# Patient Record
Sex: Female | Born: 2000 | Race: Black or African American | Hispanic: No | Marital: Single | Smoking: Never smoker
Health system: Southern US, Community
[De-identification: ages and names within clinical notes are randomized; demographics above are authoritative.]

## PROBLEM LIST (undated history)

## (undated) DIAGNOSIS — G43909 Migraine, unspecified, not intractable, without status migrainosus: Secondary | ICD-10-CM

## (undated) DIAGNOSIS — H4720 Unspecified optic atrophy: Secondary | ICD-10-CM

## (undated) DIAGNOSIS — R569 Unspecified convulsions: Secondary | ICD-10-CM

## (undated) DIAGNOSIS — K219 Gastro-esophageal reflux disease without esophagitis: Secondary | ICD-10-CM

## (undated) DIAGNOSIS — H547 Unspecified visual loss: Secondary | ICD-10-CM

## (undated) DIAGNOSIS — K5900 Constipation, unspecified: Secondary | ICD-10-CM

## (undated) DIAGNOSIS — G919 Hydrocephalus, unspecified: Secondary | ICD-10-CM

## (undated) DIAGNOSIS — B977 Papillomavirus as the cause of diseases classified elsewhere: Secondary | ICD-10-CM

## (undated) DIAGNOSIS — H4010X0 Unspecified open-angle glaucoma, stage unspecified: Secondary | ICD-10-CM

## (undated) DIAGNOSIS — I10 Essential (primary) hypertension: Secondary | ICD-10-CM

## (undated) DIAGNOSIS — G4731 Primary central sleep apnea: Secondary | ICD-10-CM

## (undated) HISTORY — DX: Migraine, unspecified, not intractable, without status migrainosus: G43.909

## (undated) HISTORY — DX: Unspecified optic atrophy: H47.20

## (undated) HISTORY — DX: Unspecified open-angle glaucoma, stage unspecified: H40.10X0

## (undated) HISTORY — DX: Gastro-esophageal reflux disease without esophagitis: K21.9

## (undated) HISTORY — DX: Constipation, unspecified: K59.00

## (undated) HISTORY — PX: BRAIN SURGERY: SHX531

## (undated) HISTORY — DX: Unspecified convulsions: R56.9

## (undated) HISTORY — DX: Primary central sleep apnea: G47.31

## (undated) HISTORY — PX: VENTRICULOPERITONEAL SHUNT: SHX204

## (undated) HISTORY — DX: Papillomavirus as the cause of diseases classified elsewhere: B97.7

---
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
## 2018-01-07 DIAGNOSIS — M25572 Pain in left ankle and joints of left foot: Secondary | ICD-10-CM | POA: Diagnosis not present

## 2018-01-07 DIAGNOSIS — M6281 Muscle weakness (generalized): Secondary | ICD-10-CM | POA: Diagnosis not present

## 2018-01-07 DIAGNOSIS — M25672 Stiffness of left ankle, not elsewhere classified: Secondary | ICD-10-CM | POA: Diagnosis not present

## 2018-01-07 DIAGNOSIS — R262 Difficulty in walking, not elsewhere classified: Secondary | ICD-10-CM | POA: Diagnosis not present

## 2018-01-09 DIAGNOSIS — M6281 Muscle weakness (generalized): Secondary | ICD-10-CM | POA: Diagnosis not present

## 2018-01-09 DIAGNOSIS — R262 Difficulty in walking, not elsewhere classified: Secondary | ICD-10-CM | POA: Diagnosis not present

## 2018-01-09 DIAGNOSIS — M25572 Pain in left ankle and joints of left foot: Secondary | ICD-10-CM | POA: Diagnosis not present

## 2018-01-09 DIAGNOSIS — M25672 Stiffness of left ankle, not elsewhere classified: Secondary | ICD-10-CM | POA: Diagnosis not present

## 2018-01-11 ENCOUNTER — Ambulatory Visit: Payer: 59 | Admitting: Gastroenterology

## 2018-01-16 DIAGNOSIS — M6281 Muscle weakness (generalized): Secondary | ICD-10-CM | POA: Diagnosis not present

## 2018-01-16 DIAGNOSIS — R262 Difficulty in walking, not elsewhere classified: Secondary | ICD-10-CM | POA: Diagnosis not present

## 2018-01-16 DIAGNOSIS — M25572 Pain in left ankle and joints of left foot: Secondary | ICD-10-CM | POA: Diagnosis not present

## 2018-01-16 DIAGNOSIS — M25672 Stiffness of left ankle, not elsewhere classified: Secondary | ICD-10-CM | POA: Diagnosis not present

## 2018-01-23 DIAGNOSIS — M6281 Muscle weakness (generalized): Secondary | ICD-10-CM | POA: Diagnosis not present

## 2018-01-23 DIAGNOSIS — R262 Difficulty in walking, not elsewhere classified: Secondary | ICD-10-CM | POA: Diagnosis not present

## 2018-01-23 DIAGNOSIS — M25672 Stiffness of left ankle, not elsewhere classified: Secondary | ICD-10-CM | POA: Diagnosis not present

## 2018-01-23 DIAGNOSIS — M25572 Pain in left ankle and joints of left foot: Secondary | ICD-10-CM | POA: Diagnosis not present

## 2018-01-25 DIAGNOSIS — M25572 Pain in left ankle and joints of left foot: Secondary | ICD-10-CM | POA: Diagnosis not present

## 2018-01-25 DIAGNOSIS — M25672 Stiffness of left ankle, not elsewhere classified: Secondary | ICD-10-CM | POA: Diagnosis not present

## 2018-01-25 DIAGNOSIS — R262 Difficulty in walking, not elsewhere classified: Secondary | ICD-10-CM | POA: Diagnosis not present

## 2018-01-25 DIAGNOSIS — M6281 Muscle weakness (generalized): Secondary | ICD-10-CM | POA: Diagnosis not present

## 2018-02-25 DIAGNOSIS — Z0279 Encounter for issue of other medical certificate: Secondary | ICD-10-CM

## 2018-03-01 DIAGNOSIS — M25572 Pain in left ankle and joints of left foot: Secondary | ICD-10-CM | POA: Diagnosis not present

## 2018-03-01 DIAGNOSIS — R262 Difficulty in walking, not elsewhere classified: Secondary | ICD-10-CM | POA: Diagnosis not present

## 2018-03-01 DIAGNOSIS — M6281 Muscle weakness (generalized): Secondary | ICD-10-CM | POA: Diagnosis not present

## 2018-03-01 DIAGNOSIS — M25672 Stiffness of left ankle, not elsewhere classified: Secondary | ICD-10-CM | POA: Diagnosis not present

## 2018-03-04 DIAGNOSIS — M25572 Pain in left ankle and joints of left foot: Secondary | ICD-10-CM | POA: Diagnosis not present

## 2018-03-04 DIAGNOSIS — M6281 Muscle weakness (generalized): Secondary | ICD-10-CM | POA: Diagnosis not present

## 2018-03-04 DIAGNOSIS — R262 Difficulty in walking, not elsewhere classified: Secondary | ICD-10-CM | POA: Diagnosis not present

## 2018-03-04 DIAGNOSIS — M25672 Stiffness of left ankle, not elsewhere classified: Secondary | ICD-10-CM | POA: Diagnosis not present

## 2018-03-05 ENCOUNTER — Other Ambulatory Visit
Admission: RE | Admit: 2018-03-05 | Discharge: 2018-03-05 | Disposition: A | Discharge status: Home / Self Care | Payer: 59 | Source: Ambulatory Visit | Attending: Internal Medicine | Admitting: Internal Medicine

## 2018-03-05 ENCOUNTER — Telehealth: Payer: Self-pay

## 2018-03-05 DIAGNOSIS — Z111 Encounter for screening for respiratory tuberculosis: Secondary | ICD-10-CM

## 2018-03-05 NOTE — Telephone Encounter (Signed)
Ordered Quantiferin gold blood test per paperwork and per Dr French Anaracy.  Patient will have blood test at hospital.  Paperwork that needs to be filled out is on my desk.

## 2018-03-06 DIAGNOSIS — M25572 Pain in left ankle and joints of left foot: Secondary | ICD-10-CM | POA: Diagnosis not present

## 2018-03-06 DIAGNOSIS — M25672 Stiffness of left ankle, not elsewhere classified: Secondary | ICD-10-CM | POA: Diagnosis not present

## 2018-03-06 DIAGNOSIS — R262 Difficulty in walking, not elsewhere classified: Secondary | ICD-10-CM | POA: Diagnosis not present

## 2018-03-06 DIAGNOSIS — M6281 Muscle weakness (generalized): Secondary | ICD-10-CM | POA: Diagnosis not present

## 2018-03-07 ENCOUNTER — Ambulatory Visit (INDEPENDENT_AMBULATORY_CARE_PROVIDER_SITE_OTHER): Payer: 59 | Admitting: Cardiovascular Disease

## 2018-03-07 ENCOUNTER — Encounter: Payer: Self-pay | Admitting: Cardiovascular Disease

## 2018-03-07 VITALS — BP 122/86 | HR 93 | Ht <= 58 in | Wt 128.0 lb

## 2018-03-07 DIAGNOSIS — R55 Syncope and collapse: Secondary | ICD-10-CM | POA: Diagnosis not present

## 2018-03-07 DIAGNOSIS — H547 Unspecified visual loss: Secondary | ICD-10-CM

## 2018-03-07 DIAGNOSIS — R000 Tachycardia, unspecified: Secondary | ICD-10-CM

## 2018-03-07 DIAGNOSIS — I10 Essential (primary) hypertension: Secondary | ICD-10-CM | POA: Diagnosis not present

## 2018-03-07 DIAGNOSIS — H4720 Unspecified optic atrophy: Secondary | ICD-10-CM | POA: Diagnosis not present

## 2018-03-07 MED ORDER — PROPRANOLOL HCL 10 MG PO TABS: 10.0000 mg | ORAL_TABLET | Freq: Three times a day (TID) | ORAL | 3 refills | Status: DC | PRN

## 2018-03-07 NOTE — Progress Notes (Signed)
Cardiology Office Note  Date:  03/07/2018   ID:  Dawn Stark, DOB 2001/06/30, MRN [id number]  PCP:  Stark-Scocuzza, Dawn Spillersracy N, MD   Chief Complaint  Patient presents with  . New Patient (Initial Visit)    Referred by PCP for Tachycardia and Syncope in Feb. Meds reviewed verbally with patient.     HPI:  [name] is a 17 year old woman with past medical history of Syncope blind Chronic constipation Headaches Referred by Dr. Sheralyn Boatmanracey Stark for consultation of her sinus tachycardia and syncope  Chronic constipation since age 89 History of hydrocephalus status post shunt Legally blind Previously tried [place] and [place]ka Lasix Lots of straining, blood when wiping Recently started on linzess  Notes indicating episode of syncope while at school Legs got weak had loss of consciousness with fall Had not skipped any meals Denied any incontinence Fractured her left foot during the fall  Previous notes indicating blood pressure 112/80 on atenolol in November 2018 Rate at that time low 100  Seen by neurology November 2018 heart rate 95 blood pressure 114/82  Seen by GI November 2018 blood pressure 104/72 pulse 101  Weight appears to be trending up over the past year Up over 25 pounds  Chronically elevated heart rate low 100s  Previously on atenolol, now taking metoprolol succinate 50 daily with no symptoms  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 She had abdominal cramping, urgency Felt lightheaded and went down Friend tried to get her up but she was still lightheaded Symptoms eventually resolved without intervention She fractured her ankle falling down No further episodes since that time  Does not drink much fluids Prefers to drink [employer]ountain Dew  Lab work reviewedFebruary 2019 TSH normal, BMP and CBC normal  EKG personally reviewed by myself on todays  visit Shows sinus tachycardia rate 96 bpm no significant ST or T-wave changes   PMH:   has a past medical history of Blind, Central sleep apnea, Constipation, GERD (gastroesophageal reflux disease), Hydrocephalus, Hypertension, Migraine, Open-angle glaucoma, Optic atrophy, both eyes, Preterm delivery, and Seizures (HCC).  PSH:    Past Surgical History:  Procedure Laterality Date  . BRAIN SURGERY     And shunt placement  . CRAINOTOMY Left 01/2012   for dural biopsy  . EXCISION OF SUBGLOTIC CYST  01/1999  . FRONTAL BOLT PLACEMENT Left 02/2010  . 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 VENTRICULOPERITONEAL SHUNT Right 10/1998  . VENTRICULOPERITONEAL SHUNT  12/2005, 03/2006, 09/2009   REVISIONS    Current Outpatient Medications  Medication Sig Dispense Refill  . acetaminophen (TYLENOL) 325 MG tablet Take by mouth.    . butalbital-acetaminophen-caffeine (FIORICET, ESGIC) 50-325-40 MG tablet butalbital-acetaminophen-caffeine 50 mg-325 mg-40 mg tablet    . etonogestrel (NEXPLANON) 68 MG IMPL implant 68 mg.    . famotidine (PEPCID) 20 MG tablet Take 20 mg by mouth 2 (two) times daily.    Marland Kitchen. FIBER SELECT GUMMIES PO Take 1 tablet daily by mouth.    Marland Kitchen. ibuprofen (ADVIL,MOTRIN) 800 MG tablet Take by mouth.    . linaclotide (LINZESS) 290 MCG CAPS capsule Take 1 capsule (290 mcg total) by mouth every other day for 90 doses. Do not crush/chew 90 capsule 0  . LINZESS 145 MCG CAPS capsule TAKE 1 CAPSULE BY MOUTH ONCE DAILY BEFORE BREAKFAST (DO  NOT  CRUSH  OR  CHEW)  1  . Melatonin 3  MG TABS Take 6 mg by mouth.     . metoprolol succinate (TOPROL-XL) 50 MG 24 hr tablet Take 1 tablet (50 mg total) by mouth at bedtime. 90 tablet 1  . nortriptyline (PAMELOR) 25 MG capsule Take 1 capsule (25 mg total) by mouth at bedtime. 90 capsule 1  . ondansetron (ZOFRAN-ODT) 4 MG disintegrating tablet Take by mouth.    . promethazine (PHENERGAN) 25 MG tablet Take by mouth.    . venlafaxine (EFFEXOR) 37.5 MG tablet Take  1 tab once a day for a week then increase to 1 tab twice a day    . zonisamide (ZONEGRAN) 100 MG capsule Take by mouth.     No current facility-administered medications for this visit.     Allergies:   Ativan [lorazepam] and Latex   Social History:  The patient  reports that she has never smoked. She has never used smokeless tobacco. She reports that she does not drink alcohol or use drugs.   Family History:   family history includes Heart attack in her father; Heart disease in her father.    Review of Systems: Review of Systems  Constitutional: Negative.   Respiratory: Negative.   Cardiovascular: Negative.   Gastrointestinal: Negative.   Musculoskeletal: Negative.   Neurological: Negative.        Remote syncope  Psychiatric/Behavioral: Negative.   All other systems reviewed and are negative.    PHYSICAL EXAM: VS:  BP 122/86 (BP Location: Left Arm, Patient Position: Sitting, Cuff Size: Normal)   Pulse 93   Ht 4\' 9"  (1.448 m)   Wt 128 lb (58.1 kg)   BMI 27.70 kg/m  , BMI Body mass index is 27.7 kg/m. GEN: Well nourished, well developed, in no acute distress  HEENT: normal  Neck: no JVD, carotid bruits, or masses Cardiac: RRR; no murmurs, rubs, or gallops,no edema  Respiratory:  clear to auscultation bilaterally, normal work of breathing GI: soft, nontender, nondistended, + BS MS: no deformity or atrophy  Skin: warm and dry, no rash Neuro:  Strength and sensation are intact Psych: euthymic mood, full affect  Recent Labs: 09/14/2017: ALT 10; BUN 16; Creat 0.76; Hemoglobin 12.7; Platelets 384; Potassium 4.2; Sodium 138; TSH 2.15    Lipid Panel No results found for: CHOL, HDL, LDLCALC, TRIG    Wt Readings from Last 3 Encounters:  03/07/18 128 lb (58.1 kg) (51 %, Z= 0.02)*  12/18/17 119 lb 6.4 oz (54.2 kg) (35 %, Z= -0.40)*  09/14/17 111 lb 3.2 oz (50.4 kg) (19 %, Z= -0.88)*   * Growth percentiles are based on CDC (Girls, 2-20 Years) data.     ASSESSMENT AND  PLAN:  Syncope, unspecified syncope type Symptoms suggestive of vasovagal etiology in the setting of having abdominal cramping prior to bowel movement.  No further episodes since that time Recommended she stay hydrated. Family reports that she does not drink much fluids Continue to work with GI concerning her constipation issues  Sinus tachycardia chronic issue,for the most part is asymptomatic Event monitor has been ordered at her request recommended aggressive hydration For high level of exercise she may warrant additional beta blocker such as propranolol or extra half dose of metoprolol. Prescription provided for propranolol In general does not do regular competitive athletics and is nonsymptomatic Previously did cheerleading and was relatively asymptomatic.   Essential hypertension Blood pressure is well controlled on today's visit. No changes made to the medications.  Blind Previously attended [employer] [facility] [employer]  for college  Disposition:   F/U  As needed   Total encounter time more than 60 minutes  Greater than 50% was spent in counseling and coordination of care with the patient  Patient was seen in consultation for Dr. Desma Maxim  LV referred back to her office for ongoing care of the issues detailed above   No orders of the defined types were placed in this encounter.    Signed, Dossie Arbour, M.D., Ph.D. 03/07/2018  [facility] [place], [place] [phone]

## 2018-03-07 NOTE — Patient Instructions (Addendum)
Medication Instructions:   Please take propranolol as needed for shortness of breath, chest tightness, tachycardia   Labwork:  No new labs needed  Testing/Procedures:  We will order a 2 week monitor to evaluate heart rhythm/heart rate Sinus tachycardia   Follow-Up: It was a pleasure seeing you in the office today. Please call us if you have new issues that need to be addressed before your next appt.  [phone]  Your physician wants you to follow-up in:  As needed  If you need a refill on your cardiac medications before your next appointment, please call your pharmacy.  For educational health videos Log in to : [email or web address] Or : [email or web address], password : [email or web address]

## 2018-03-08 DIAGNOSIS — M6281 Muscle weakness (generalized): Secondary | ICD-10-CM | POA: Diagnosis not present

## 2018-03-08 DIAGNOSIS — R262 Difficulty in walking, not elsewhere classified: Secondary | ICD-10-CM | POA: Diagnosis not present

## 2018-03-08 DIAGNOSIS — M25672 Stiffness of left ankle, not elsewhere classified: Secondary | ICD-10-CM | POA: Diagnosis not present

## 2018-03-08 DIAGNOSIS — M25572 Pain in left ankle and joints of left foot: Secondary | ICD-10-CM | POA: Diagnosis not present

## 2018-03-12 ENCOUNTER — Ambulatory Visit: Payer: Self-pay

## 2018-03-12 LAB — QUANTIFERON-TB GOLD PLUS: QUANTIFERON-TB GOLD PLUS: NEGATIVE

## 2018-03-12 LAB — QUANTIFERON-TB GOLD PLUS (RQFGPL)
QUANTIFERON TB1 AG VALUE: 0.02 [IU]/mL
QuantiFERON Mitogen Value: >10 IU/mL
QuantiFERON Nil Value: 0.02 IU/mL
QuantiFERON TB2 Ag Value: 0.02 IU/mL

## 2018-03-12 NOTE — Telephone Encounter (Signed)
fyi

## 2018-03-12 NOTE — Telephone Encounter (Signed)
Mom called to report pt. Has a painful knot behind her right ear at her shunt site. No other symptoms - no fever. Mom has been applying ice packs. Reports she called Neurology (Dr. Malvin JohnsPotter) and was instructed to call her PCP. Practice [facility]) instructing pt. To go to ED or UC. Mom notified and verbalizes understanding.  Reason for Disposition . Earache  (Exceptions: brief ear pain of < 60 minutes duration, earache occurring during air travel  Answer Assessment - Initial Assessment Questions 1. LOCATION: "Which ear is involved?"     Right ear 2. ONSET: "When did the ear start hurting"      3 days 3. SEVERITY: "How bad is the pain?"  (Scale 1-10; mild, moderate or severe)   - MILD (1-3): doesn't interfere with normal activities    - MODERATE (4-7): interferes with normal activities or awakens from sleep    - SEVERE (8-10): excruciating pain, unable to do any normal activities       8-9 4. URI SYMPTOMS: " Do you have a runny nose or cough?"     No 5. FEVER: "Do you have a fever?" If so, ask: "What is your temperature, how was it measured, and when did it start?"     No 6. CAUSE: "Have you been swimming recently?", "How often do you use Q-TIPS?", "Have you had any recent air travel or scuba diving?"     No 7. OTHER SYMPTOMS: "Do you have any other symptoms?" (e.g., headache, stiff neck, dizziness, vomiting, runny nose, decreased hearing)     No 8. PREGNANCY: "Is there any chance you are pregnant?" "When was your last menstrual period?"     No  Protocols used: EARACHE-A-AH

## 2018-03-12 NOTE — Telephone Encounter (Signed)
Patient should have appt to evaluate ear/shunt site  Thanks, LG

## 2018-03-13 ENCOUNTER — Encounter: Payer: Self-pay | Admitting: *Deleted

## 2018-03-13 ENCOUNTER — Telehealth: Payer: Self-pay | Admitting: Cardiovascular Disease

## 2018-03-13 DIAGNOSIS — G911 Obstructive hydrocephalus: Secondary | ICD-10-CM | POA: Diagnosis not present

## 2018-03-13 DIAGNOSIS — Z982 Presence of cerebrospinal fluid drainage device: Secondary | ICD-10-CM | POA: Diagnosis not present

## 2018-03-13 DIAGNOSIS — G44221 Chronic tension-type headache, intractable: Secondary | ICD-10-CM | POA: Diagnosis not present

## 2018-03-13 DIAGNOSIS — R51 Headache: Secondary | ICD-10-CM | POA: Diagnosis not present

## 2018-03-13 NOTE — Telephone Encounter (Signed)
Pt mother is calling states pt HR is 138. Denies any other symptoms. States pt ZIO monitor has came off and is unable to put it back on.

## 2018-03-13 NOTE — Telephone Encounter (Signed)
No answer/No voicemail available  

## 2018-03-14 NOTE — Telephone Encounter (Signed)
Patient's mother came by  Stated the best number to get in contact with her is [phone], she will be there until 3p

## 2018-03-14 NOTE — Telephone Encounter (Signed)
Tried the number listed [phone]), it rang several times and no answer/voicemail.  Attempted home number and no answer and no voicemail.

## 2018-03-14 NOTE — Telephone Encounter (Signed)
No answer.  No voicemail setup

## 2018-03-15 ENCOUNTER — Other Ambulatory Visit: Payer: Self-pay | Admitting: Gastroenterology

## 2018-03-15 DIAGNOSIS — K5909 Other constipation: Secondary | ICD-10-CM

## 2018-03-15 DIAGNOSIS — K581 Irritable bowel syndrome with constipation: Secondary | ICD-10-CM

## 2018-03-15 NOTE — Telephone Encounter (Signed)
Spoke with patients mother per release form and she reports that her daughters heart rate was 138 during recent appointment with neurology. She also states that monitor fell off yesterday as well. Reviewed that since monitor was on for one week we could wait and see if there was enough data and if needed we could also place another. Instructed her to send monitor back and we can see what that shows. Instructed her to also have patient take the propranolol for fast heart rates and to monitor blood pressures as well. She verbalized understanding of our conversation, agreement with plan and had no further questions at this time.

## 2018-03-18 ENCOUNTER — Telehealth: Payer: Self-pay

## 2018-03-18 NOTE — Telephone Encounter (Signed)
Patients mother contacted office.  Her daughter is no longer having regular bowel movements despite  taking 290 linzess.  She states that you mentioned she may need a procedure Colon or EGD.  [facility].  Thanks [employer]le

## 2018-03-19 ENCOUNTER — Other Ambulatory Visit: Payer: Self-pay

## 2018-03-19 DIAGNOSIS — K5909 Other constipation: Secondary | ICD-10-CM

## 2018-03-19 DIAGNOSIS — K581 Irritable bowel syndrome with constipation: Secondary | ICD-10-CM

## 2018-03-19 MED ORDER — LINACLOTIDE 290 MCG PO CAPS: 290.0000 ug | ORAL_CAPSULE | Freq: Every day | ORAL | 0 refills | Status: DC

## 2018-03-19 NOTE — Telephone Encounter (Signed)
Spoke with pt's mom and she stated that patient takes Miralax daily along with the linzess but still has no relief, but pt is currently out of linzess and mom is requesting a refill she continued taking Miralax but is not helping much. Refill for linzess 290 has been sent to pharmacy. Would you like for patient to schedule appt? Please advise    Thanks TG

## 2018-03-22 ENCOUNTER — Ambulatory Visit: Payer: 59 | Admitting: Gastroenterology

## 2018-03-28 DIAGNOSIS — M25672 Stiffness of left ankle, not elsewhere classified: Secondary | ICD-10-CM | POA: Diagnosis not present

## 2018-03-28 DIAGNOSIS — M25572 Pain in left ankle and joints of left foot: Secondary | ICD-10-CM | POA: Diagnosis not present

## 2018-03-28 DIAGNOSIS — M6281 Muscle weakness (generalized): Secondary | ICD-10-CM | POA: Diagnosis not present

## 2018-03-28 DIAGNOSIS — R262 Difficulty in walking, not elsewhere classified: Secondary | ICD-10-CM | POA: Diagnosis not present

## 2018-04-11 NOTE — Telephone Encounter (Signed)
Spoke with patients mother regarding heart monitor that has not been received. She reports that patients father was supposed to mail it back but has been in the hospital. She will check on this monitor and make sure that it gets sent out. Let her know that once we get report we will call with results. She verbalized understanding with no further questions at this time.

## 2018-04-18 DIAGNOSIS — M25672 Stiffness of left ankle, not elsewhere classified: Secondary | ICD-10-CM | POA: Diagnosis not present

## 2018-04-18 DIAGNOSIS — M25572 Pain in left ankle and joints of left foot: Secondary | ICD-10-CM | POA: Diagnosis not present

## 2018-04-18 DIAGNOSIS — R262 Difficulty in walking, not elsewhere classified: Secondary | ICD-10-CM | POA: Diagnosis not present

## 2018-04-18 DIAGNOSIS — M6281 Muscle weakness (generalized): Secondary | ICD-10-CM | POA: Diagnosis not present

## 2018-04-25 DIAGNOSIS — M6281 Muscle weakness (generalized): Secondary | ICD-10-CM | POA: Diagnosis not present

## 2018-04-25 DIAGNOSIS — R262 Difficulty in walking, not elsewhere classified: Secondary | ICD-10-CM | POA: Diagnosis not present

## 2018-04-25 DIAGNOSIS — M25572 Pain in left ankle and joints of left foot: Secondary | ICD-10-CM | POA: Diagnosis not present

## 2018-04-25 DIAGNOSIS — M25672 Stiffness of left ankle, not elsewhere classified: Secondary | ICD-10-CM | POA: Diagnosis not present

## 2018-04-29 ENCOUNTER — Telehealth: Payer: Self-pay

## 2018-04-29 DIAGNOSIS — M25572 Pain in left ankle and joints of left foot: Secondary | ICD-10-CM | POA: Diagnosis not present

## 2018-04-29 DIAGNOSIS — M25672 Stiffness of left ankle, not elsewhere classified: Secondary | ICD-10-CM | POA: Diagnosis not present

## 2018-04-29 DIAGNOSIS — R262 Difficulty in walking, not elsewhere classified: Secondary | ICD-10-CM | POA: Diagnosis not present

## 2018-04-29 DIAGNOSIS — M6281 Muscle weakness (generalized): Secondary | ICD-10-CM | POA: Diagnosis not present

## 2018-04-29 NOTE — Telephone Encounter (Signed)
Tried calling no answer , unable to leave message

## 2018-04-29 NOTE — Telephone Encounter (Signed)
Copied from CRM [id number]. Topic: Inquiry >> Apr 29, 2018  9:54 AM Alexander Bergeron B wrote: Reason for CRM: pt's mother called and states she has a shot on the right side of her neck and is having trouble turning her neck and has had pain for the past 4 days; contact to advise

## 2018-05-01 ENCOUNTER — Other Ambulatory Visit: Payer: Self-pay | Admitting: Internal Medicine

## 2018-05-01 ENCOUNTER — Telehealth: Payer: Self-pay | Admitting: Internal Medicine

## 2018-05-01 DIAGNOSIS — Z982 Presence of cerebrospinal fluid drainage device: Secondary | ICD-10-CM

## 2018-05-01 NOTE — Telephone Encounter (Signed)
She does need a referral. But daughter is in a lot of pain. Please advise.

## 2018-05-01 NOTE — Telephone Encounter (Signed)
If she really In pain she needs to go to emergency room would be my recommendation

## 2018-05-01 NOTE — Telephone Encounter (Signed)
See duplicate telephone encounter.

## 2018-05-01 NOTE — Telephone Encounter (Signed)
The patient's mother was informed to see her neurologist Dr. Malvin Johns.

## 2018-05-01 NOTE — Telephone Encounter (Signed)
Copied from CRM [id number]. Topic: General - Other >> Apr 30, 2018 10:46 AM Ronney Lion A wrote:  Mom called in on behalf of the patient  who is having trouble turning her head to the left after her VP shunt, mom called in to see if she can have a call back regarding this. She says if its after 3pm please call her cell.    [phone] work number [phone] cell >> Apr 30, 2018 10:55 AM Conrad [place], CMA wrote: Wrong office.

## 2018-05-01 NOTE — Telephone Encounter (Signed)
Not neurology  She needs neurosurgery  Does she need referral?

## 2018-05-01 NOTE — Telephone Encounter (Signed)
Patients mother has been informed of the below.

## 2018-05-08 ENCOUNTER — Telehealth: Payer: Self-pay

## 2018-05-08 NOTE — Telephone Encounter (Signed)
Copied from CRM [id number]. Topic: General - Inquiry >> May 08, 2018  3:25 PM Marylen Ponto wrote: Reason for CRM: Patty with [facility] states she contacted pt for scheduling but pt mother refused scheduling and stating pt is doing ok. Cb# [phone]

## 2018-05-09 DIAGNOSIS — M25672 Stiffness of left ankle, not elsewhere classified: Secondary | ICD-10-CM | POA: Diagnosis not present

## 2018-05-09 DIAGNOSIS — M6281 Muscle weakness (generalized): Secondary | ICD-10-CM | POA: Diagnosis not present

## 2018-05-09 DIAGNOSIS — R262 Difficulty in walking, not elsewhere classified: Secondary | ICD-10-CM | POA: Diagnosis not present

## 2018-05-09 DIAGNOSIS — M25572 Pain in left ankle and joints of left foot: Secondary | ICD-10-CM | POA: Diagnosis not present

## 2018-05-31 ENCOUNTER — Telehealth: Payer: Self-pay | Admitting: Gastroenterology

## 2018-05-31 NOTE — Telephone Encounter (Signed)
LVM asking pt to call office and schedule appt before medication can be refilled. Pt was last seen in March and cancelled last two appts.

## 2018-05-31 NOTE — Telephone Encounter (Signed)
Pt is calling to get refill on rx [place] to [facility] [address]

## 2018-06-05 DIAGNOSIS — R05 Cough: Secondary | ICD-10-CM | POA: Diagnosis not present

## 2018-06-05 DIAGNOSIS — R070 Pain in throat: Secondary | ICD-10-CM | POA: Diagnosis not present

## 2018-06-24 DIAGNOSIS — M25672 Stiffness of left ankle, not elsewhere classified: Secondary | ICD-10-CM | POA: Diagnosis not present

## 2018-06-24 DIAGNOSIS — M25572 Pain in left ankle and joints of left foot: Secondary | ICD-10-CM | POA: Diagnosis not present

## 2018-06-24 DIAGNOSIS — M6281 Muscle weakness (generalized): Secondary | ICD-10-CM | POA: Diagnosis not present

## 2018-06-27 DIAGNOSIS — M25572 Pain in left ankle and joints of left foot: Secondary | ICD-10-CM | POA: Diagnosis not present

## 2018-06-27 DIAGNOSIS — M6281 Muscle weakness (generalized): Secondary | ICD-10-CM | POA: Diagnosis not present

## 2018-07-08 DIAGNOSIS — M25672 Stiffness of left ankle, not elsewhere classified: Secondary | ICD-10-CM | POA: Diagnosis not present

## 2018-07-08 DIAGNOSIS — R262 Difficulty in walking, not elsewhere classified: Secondary | ICD-10-CM | POA: Diagnosis not present

## 2018-07-08 DIAGNOSIS — M6281 Muscle weakness (generalized): Secondary | ICD-10-CM | POA: Diagnosis not present

## 2018-07-08 DIAGNOSIS — M25572 Pain in left ankle and joints of left foot: Secondary | ICD-10-CM | POA: Diagnosis not present

## 2018-08-01 DIAGNOSIS — M25672 Stiffness of left ankle, not elsewhere classified: Secondary | ICD-10-CM | POA: Diagnosis not present

## 2018-08-01 DIAGNOSIS — M25572 Pain in left ankle and joints of left foot: Secondary | ICD-10-CM | POA: Diagnosis not present

## 2018-08-01 DIAGNOSIS — M6281 Muscle weakness (generalized): Secondary | ICD-10-CM | POA: Diagnosis not present

## 2018-08-01 DIAGNOSIS — R262 Difficulty in walking, not elsewhere classified: Secondary | ICD-10-CM | POA: Diagnosis not present

## 2018-09-16 DIAGNOSIS — R51 Headache: Secondary | ICD-10-CM | POA: Diagnosis not present

## 2018-09-16 DIAGNOSIS — I10 Essential (primary) hypertension: Secondary | ICD-10-CM | POA: Diagnosis not present

## 2018-10-02 ENCOUNTER — Ambulatory Visit: Payer: Self-pay | Admitting: *Deleted

## 2018-10-02 NOTE — Telephone Encounter (Signed)
Pt's mother calling initially, pt not present. Pts mother reports pt had nosebleeds, 2-3 day since Sunday. States some clots noted, moderate. TN called pt to triage. Pt is blind. Pt states nose presently bleeding; has placed tissue in right nare. States "Pretty much on tissue." Bleeding has been from right nare only. Denies lightheadedness, headache. Pt\'s mother reports BP 124/98  HR 126.  Attempted to have pt check pulse during call, unable to do so. Instructed pt on correct technique to stop nose bleed. Pt verbalizes understanding. No availability at practice today. TN called pts mother to direct to UC; declined. Appt made with L. Guse for tomorrow 1100.  Instructed to go to UC/ED if nose bleeds worsen in frequency or duration, clots increase, lightheadedness. Reviewed with pts mother correct technique to stop nose bleed; verbalizes understanding.  Reason for Disposition . [1] Large amount of blood has been lost (e.g., 1 cup or 240 ml) AND [2] bleeding now controlled (stopped)  Answer Assessment - Initial Assessment Questions 1. AMOUNT OF BLEEDING: "How bad is the bleeding?" "How much blood was lost?" "Has the bleeding stopped?"   - MILD: needed a couple tissues   - MODERATE: needed many tissues   - SEVERE: large blood clots, soaked many tissues, lasted more than 30 minutes      Severe, right nare only 2. ONSET: "When did the nosebleed start?"      Sunday 3. FREQUENCY: "How many nosebleeds have you had in the last 24 hours?"      2-3 yesterday 4. RECURRENT SYMPTOMS: "Have there been other recent nosebleeds?" If so, ask: "How long did it take you to stop the bleeding?" "What worked best?"      no 5. CAUSE: "What do you think caused this nosebleed?"     unsure 6. LOCAL FACTORS: "Do you have any cold symptoms?", "Have you been rubbing or picking at your nose?"     no 7. SYSTEMIC FACTORS: "Do you have high blood pressure or any bleeding problems?"    Yes.   8. BLOOD THINNERS: "Do you take any  blood thinners?" (e.g., coumadin, heparin, aspirin, Plavix)     no 9. OTHER SYMPTOMS: "Do you have any other symptoms?" (e.g., lightheadedness)     12 4/98  HR126,  Protocols used: NOSEBLEED-A-AH

## 2018-10-03 ENCOUNTER — Other Ambulatory Visit: Payer: Self-pay

## 2018-10-03 ENCOUNTER — Encounter: Payer: Self-pay | Admitting: Family Medicine

## 2018-10-03 ENCOUNTER — Ambulatory Visit (INDEPENDENT_AMBULATORY_CARE_PROVIDER_SITE_OTHER): Payer: 59 | Admitting: Family Medicine

## 2018-10-03 VITALS — BP 100/82 | HR 97 | Temp 98.4°F | Resp 16 | Ht <= 58 in | Wt 133.8 lb

## 2018-10-03 DIAGNOSIS — R040 Epistaxis: Secondary | ICD-10-CM | POA: Diagnosis not present

## 2018-10-03 MED ORDER — OXYMETAZOLINE HCL 0.05 % NA SOLN: 1.0000 | Freq: Two times a day (BID) | NASAL | 1 refills | Status: DC | PRN

## 2018-10-03 MED ORDER — SALINE NASAL SPRAY 0.65 % NA SOLN: NASAL | 12 refills | Status: DC

## 2018-10-03 NOTE — Patient Instructions (Signed)
Nosebleed, Adult  A nosebleed is when blood comes out of the nose. Nosebleeds are common. Usually, they are not a sign of a serious condition.  Nosebleeds can happen if a small blood vessel in your nose starts to bleed or if the lining of your nose (mucous membrane) cracks. They are commonly caused by:   Allergies.   Colds.   Picking your nose.   Blowing your nose too hard.   An injury from sticking an object into your nose or getting hit in the nose.   Dry or cold air.  Less common causes of nosebleeds include:   Toxic fumes.   Something abnormal in the nose or in the air-filled spaces in the bones of the face (sinuses).   Growths in the nose, such as polyps.   Medicines or conditions that cause blood to clot slowly.   Certain illnesses or procedures that irritate or dry out the nasal passages.  Follow these instructions at home:  When you have a nosebleed:     Sit down and tilt your head slightly forward.   Use a clean towel or tissue to pinch your nostrils under the bony part of your nose. After 10 minutes, let go of your nose and see if bleeding starts again. Do not release pressure before that time. If there is still bleeding, repeat the pinching and holding for 10 minutes until the bleeding stops.   Do not place tissues or gauze in the nose to stop bleeding.   Avoid lying down and avoid tilting your head backward. That may make blood collect in the throat and cause gagging or coughing.   Use a nasal spray decongestant to help with a nosebleed as told by your health care provider.   Do not use petroleum jelly or mineral oil in your nose. It can drip into your lungs.  After a nosebleed:   Avoid blowing your nose or sniffing for a number of hours.   Avoid straining, lifting, or bending at the waist for several days. You may resume other normal activities as you are able.   Use saline spray or a humidifier as told by your health care provider.   Aspirinand blood thinners make bleeding more  likely. If you are prescribed these medicines and you suffer from nosebleeds:  ? Ask your health care provider if you should stop taking the medicines or if you should adjust the dose.  ? Do not stop taking medicines that your health care provider has recommended unless told by your health care provider.   If your nosebleed was caused by dry mucous membranes, use over-the-counter saline nasal spray or gel. This will keep the mucous membranes moist and allow them to heal. If you must use a lubricant:  ? Choose one that is water-soluble.  ? Use only as much as you need and use it only as often as needed.  ? Do not lie down until several hours after you use it.  Contact a health care provider if:   You have a fever.   You get nosebleeds often or more often than usual.   You bruise very easily.   You have a nosebleed from having something stuck in your nose.   You have bleeding in your mouth.   You vomit or cough up brown material.   You have a nosebleed after you start a new medicine.  Get help right away if:   You have a nosebleed after a fall or a head injury.     Your nosebleed does not go away after 20 minutes.   You feel dizzy or weak.   You have unusual bleeding from other parts of your body.   You have unusual bruising on other parts of your body.   You become sweaty.   You vomit blood.  This information is not intended to replace advice given to you by your health care provider. Make sure you discuss any questions you have with your health care provider.  Document Released: 04/19/2005 Document Revised: 11/14/2016 Document Reviewed: 01/25/2016  Elsevier Interactive Patient Education  2019 [employer]

## 2018-10-03 NOTE — Progress Notes (Signed)
Subjective:    Patient ID: Dawn Stark, female    DOB: May 15, 2001, 18 y.o.   MRN: [id number]  HPI   Patient resents to clinic complaining of 2 episodes of nosebleeding from right nare.  1 occurred 3 days ago, the other occurred yesterday.  Each lasted for less than a minute.  Patient's mother was able to help her get them stopped up.  She has never had issues with nosebleeds before.  Denies fever or chills.  Denies cough.  Denies nausea, vomiting or diarrhea.  Patient Active Problem List   Diagnosis Date Noted  . Palpitations 09/18/2017  . Sinus tachycardia 09/18/2017  . Syncope 09/18/2017  . 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 Sports physical 06/04/2017  . Migraine 04/02/2017  . Headache disorder 03/19/2017  . Acute right-sided thoracic back pain 03/06/2017  . Right upper quadrant abdominal pain 03/06/2017  . Obstructive hydrocephalus (HCC)   . Blindness   . Complex care coordination 12/06/2016  . Special needs assessment 12/06/2016  . Acute intractable headache 01/26/2016  . Encounter for management and injection of injectable progestin contraceptive 01/21/2016  . Gastroesophageal reflux disease without esophagitis 04/23/2014  . Constipation 05/23/2013  . Blindness of both eyes 11/05/2012  . 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 Premenopausal menorrhagia 02/26/2012  . Loss of vision 12/21/2010  . Hypertension 12/13/2010  . Open-angle glaucoma 11/24/2010  . Central sleep apnea 10/07/2010  . Preterm delivery 02/25/2010   Social History   Tobacco Use  . Smoking status: Never Smoker  . Smokeless tobacco: Never Used  Substance Use Topics  . Alcohol use: No   Review of Systems  Constitutional: Negative for chills, fatigue and fever.  HENT: Negative for congestion, ear pain, sinus pain and sore throat.  +nose bleed x2 from right nare.  Eyes: Negative.   Respiratory: Negative for cough, shortness of breath and wheezing.   Cardiovascular: Negative for chest pain,  palpitations and leg swelling.  Gastrointestinal: Negative for abdominal pain, diarrhea, nausea and vomiting.  Genitourinary: Negative for dysuria, frequency and urgency.  Musculoskeletal: Negative for arthralgias and myalgias.  Skin: Negative for color change, pallor and rash.  Neurological: Negative for syncope, light-headedness and headaches.  Psychiatric/Behavioral: The patient is not nervous/anxious.       Objective:   Physical Exam Vitals signs and nursing note reviewed.  Constitutional:      General: She is not in acute distress.    Appearance: She is not toxic-appearing.  HENT:     Head: Normocephalic and atraumatic.     Right Ear: Tympanic membrane, ear canal and external ear normal.     Left Ear: Tympanic membrane, ear canal and external ear normal.     Nose: Mucosal edema (nares appear inflamed), congestion and rhinorrhea (clear drainage) present.     Mouth/Throat:     Mouth: Mucous membranes are moist.     Pharynx: No oropharyngeal exudate or posterior oropharyngeal erythema.  Eyes:     General: No scleral icterus.       Right eye: No discharge.        Left eye: No discharge.     Conjunctiva/sclera: Conjunctivae normal.  Neurological:     Mental Status: She is alert.    Vitals:   10/03/18 1106  BP: 100/82  Pulse: 97  Resp: 16  Temp: 98.4 F (36.9 C)  SpO2: 96%      Assessment & Plan:    Epistaxis - suspect patient's episode of nosebleeding x2 was related  to inflammation in the naris.  Patient will begin doing saline nasal spray at least once per day to help keep nares moist.  She also has been prescribed Afrin to use if needed for nose bleeding episode.  Patient also given handout outlining what to do during the case of a nosebleed.  She will keep regularly scheduled follow-up PCP as planned and return to clinic sooner if any issues arise.

## 2018-10-05 ENCOUNTER — Emergency Department
Admission: EM | Admit: 2018-10-05 | Discharge: 2018-10-05 | Disposition: A | Discharge status: Home / Self Care | Payer: 59 | Attending: Emergency Medicine | Admitting: Emergency Medicine

## 2018-10-05 ENCOUNTER — Encounter: Payer: Self-pay | Admitting: Emergency Medicine

## 2018-10-05 ENCOUNTER — Other Ambulatory Visit: Payer: Self-pay

## 2018-10-05 DIAGNOSIS — R040 Epistaxis: Secondary | ICD-10-CM | POA: Diagnosis not present

## 2018-10-05 LAB — CBC WITH DIFFERENTIAL/PLATELET
Abs Immature Granulocytes: 0.03 10*3/uL (ref 0.00–0.07)
BASOS ABS: 0.1 10*3/uL (ref 0.0–0.1)
BASOS PCT: 0 %
EOS ABS: 0.3 10*3/uL (ref 0.0–0.5)
EOS PCT: 2 %
HEMATOCRIT: 38.5 % (ref 36.0–46.0)
HEMOGLOBIN: 12.9 g/dL (ref 12.0–15.0)
Immature Granulocytes: 0 %
LYMPHS PCT: 39 %
Lymphs Abs: 4.9 10*3/uL — ABNORMAL HIGH (ref 0.7–4.0)
MCH: 29.7 pg (ref 26.0–34.0)
MCHC: 33.5 g/dL (ref 30.0–36.0)
MCV: 88.5 fL (ref 80.0–100.0)
MONO ABS: 0.8 10*3/uL (ref 0.1–1.0)
Monocytes Relative: 6 %
NRBC: 0 % (ref 0.0–0.2)
Neutro Abs: 6.5 10*3/uL (ref 1.7–7.7)
Neutrophils Relative %: 53 %
Platelets: 416 10*3/uL — ABNORMAL HIGH (ref 150–400)
RBC: 4.35 MIL/uL (ref 3.87–5.11)
RDW: 11.9 % (ref 11.5–15.5)
WBC: 12.6 10*3/uL — AB (ref 4.0–10.5)

## 2018-10-05 LAB — COMPREHENSIVE METABOLIC PANEL
ALT: 15 U/L (ref 0–44)
AST: 15 U/L (ref 15–41)
Albumin: 4.3 g/dL (ref 3.5–5.0)
Alkaline Phosphatase: 100 U/L (ref 38–126)
Anion gap: 9 (ref 5–15)
BILIRUBIN TOTAL: 0.7 mg/dL (ref 0.3–1.2)
BUN: 13 mg/dL (ref 6–20)
CHLORIDE: 101 mmol/L (ref 98–111)
CO2: 28 mmol/L (ref 22–32)
CREATININE: 0.69 mg/dL (ref 0.44–1.00)
Calcium: 9.3 mg/dL (ref 8.9–10.3)
Glucose, Bld: 102 mg/dL — ABNORMAL HIGH (ref 70–99)
POTASSIUM: 3.2 mmol/L — AB (ref 3.5–5.1)
Sodium: 138 mmol/L (ref 135–145)
TOTAL PROTEIN: 8 g/dL (ref 6.5–8.1)

## 2018-10-05 MED ORDER — FLUTICASONE PROPIONATE 50 MCG/ACT NA SUSP: 1.0000 | Freq: Two times a day (BID) | NASAL | 0 refills | Status: DC

## 2018-10-05 MED ORDER — SODIUM CHLORIDE 0.9 % IV BOLUS
1000.0000 mL | Freq: Once | INTRAVENOUS | Status: AC
  Administered 2018-10-05: 1000 mL via INTRAVENOUS

## 2018-10-05 NOTE — ED Triage Notes (Signed)
Pt reports right nare bleeding since 4pm this afternoon; pt has had intermittent nose bleeds since last Sunday and has seen her provider for same; was prescribed Afrin on Thursday and has stopped the nosebleeds until today; pt denies headache; nose clamp placed at this time;

## 2018-10-05 NOTE — ED Notes (Signed)
Provider at bedside at this time

## 2018-10-05 NOTE — ED Notes (Signed)
No bleeding noted in triage after clamp placed

## 2018-10-05 NOTE — ED Notes (Signed)
Pt verbalized understanding of d/c instructions, RX, and f/u care. No further questions at this time. Pt ambulatory to the exit with steady gait accompanied by Mother.

## 2018-10-05 NOTE — ED Notes (Signed)
Pt here with Mother for ongoing nose bleeds. Pt has no previous hx of nosebleeds, however she reports a nose bleed everyday this week. Seen by PCP on Thursday and given Afrin, has continued to have nose bleeds, and today reports 6 episodes of bleeding. Denies any HA, lightheadedness of dizziness. Pt denies any facial trauma or injury.

## 2018-10-05 NOTE — ED Provider Notes (Signed)
[facility] Emergency Department Provider Note  ____________________________________________  Time seen: Approximately 8:17 PM  I have reviewed the triage vital signs and the nursing notes.   HISTORY  Chief Complaint Epistaxis    HPI Dawn Stark is a 18 y.o. female who presents the emergency department complaining of nosebleeds for the past week.  Per the patient and the mother, patient has had new onset of daily epistaxis.  Patient was seen by primary care, diagnosed with irritation of the nares with nosebleed and started on Afrin.  Patient did not have a nosebleed yesterday but started again with a nosebleed today.  They have difficulty controlling the bleeding but eventually it will be controlled direct pressure, laying forward.  Patient has no history of bleeding or clotting disorders.  Patient does have other medical conditions to include blindness, sleep apnea, GERD, hydrocephalus, hypertension, seizures.  No complaints of chronic medical issues.  Patient has had a new medication that has recently increased in dosing.  Patient has difficulty sleeping at night and was on Benadryl until she started to have side effects from routine use of this medication.  Patient was then switched to amitriptyline but the initial starting dose of 10 mg did not seem to be effective.  Patient was rapidly increased to 25 mg.  The increase of medication occurred approximately 3 days prior to the onset of epistaxis.  Patient has had no other significant ecchymosis or any indication of thrombocytopenic purpura.         Past Medical History:  Diagnosis Date  . Blind    since age 79 y.o   . Central sleep apnea   . Constipation   . GERD (gastroesophageal reflux disease)   . Hydrocephalus (HCC)   . Hypertension   . Migraine   . Open-angle glaucoma   . Optic atrophy, both eyes   . Preterm delivery    25 weeks  . Seizures [facility])     Patient Active Problem List   Diagnosis  Date Noted  . Palpitations 09/18/2017  . Sinus tachycardia 09/18/2017  . Syncope 09/18/2017  . 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 Sports physical 06/04/2017  . Migraine 04/02/2017  . Headache disorder 03/19/2017  . Acute right-sided thoracic back pain 03/06/2017  . Right upper quadrant abdominal pain 03/06/2017  . Obstructive hydrocephalus (HCC)   . Blindness   . Complex care coordination 12/06/2016  . Special needs assessment 12/06/2016  . Acute intractable headache 01/26/2016  . Encounter for management and injection of injectable progestin contraceptive 01/21/2016  . Gastroesophageal reflux disease without esophagitis 04/23/2014  . Constipation 05/23/2013  . Blindness of both eyes 11/05/2012  . 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 Premenopausal menorrhagia 02/26/2012  . Loss of vision 12/21/2010  . Hypertension 12/13/2010  . Open-angle glaucoma 11/24/2010  . Central sleep apnea 10/07/2010  . Preterm delivery 02/25/2010    Past Surgical History:  Procedure Laterality Date  . BRAIN SURGERY     And shunt placement  . CRAINOTOMY Left 01/2012   for dural biopsy  . EXCISION OF SUBGLOTIC CYST  01/1999  . FRONTAL BOLT PLACEMENT Left 02/2010  . 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 VENTRICULOPERITONEAL SHUNT Right 10/1998  . VENTRICULOPERITONEAL SHUNT  12/2005, 03/2006, 09/2009   REVISIONS    Prior to Admission medications   Medication Sig Start Date End Date Taking? Authorizing Provider  acetaminophen (TYLENOL) 325 MG tablet Take by mouth.   Yes [provider]  butalbital-acetaminophen-caffeine (FIORICET,  ESGIC) 50-325-40 MG tablet butalbital-acetaminophen-caffeine 50 mg-325 mg-40 mg tablet   Yes [provider]  etonogestrel (NEXPLANON) 68 MG IMPL implant 68 mg. 12/22/16  Yes [provider]  famotidine (PEPCID) 20 MG tablet Take 20 mg by mouth 2 (two) times daily.   Yes [provider]  FIBER SELECT GUMMIES PO Take 1 tablet daily by mouth.    Yes [provider]  ibuprofen (ADVIL,MOTRIN) 800 MG tablet Take by mouth.   Yes [provider]  linaclotide (LINZESS) 290 MCG CAPS capsule Take 1 capsule (290 mcg total) by mouth daily. Do not crush/chew 03/19/18 10/05/18 Yes Vanga, Loel Dubonnet, MD  metoprolol succinate (TOPROL-XL) 50 MG 24 hr tablet Take 1 tablet (50 mg total) by mouth at bedtime. 09/14/17  Yes McLean-Scocuzza, Pasty Spillers, MD  nortriptyline (PAMELOR) 25 MG capsule Take 1 capsule (25 mg total) by mouth at bedtime. 09/14/17  Yes McLean-Scocuzza, Pasty Spillers, MD  oxymetazoline (AFRIN NASAL SPRAY) 0.05 % nasal spray Place 1 spray into both nostrils 2 (two) times daily as needed (for nose bleed). 10/03/18  Yes Guse, Janna Arch, FNP  propranolol (INDERAL) 10 MG tablet Take 1 tablet (10 mg total) by mouth 3 (three) times daily as needed. 03/07/18  Yes Gollan, Tollie Pizza, MD  fluticasone (FLONASE) 50 MCG/ACT nasal spray Place 1 spray into both nostrils 2 (two) times daily. 10/05/18   Marios Gaiser, Delorise Royals, PA-C    Allergies Ativan [lorazepam] and Latex  Family History  Problem Relation Age of Onset  . Heart attack Father   . Heart disease Father     Social History Social History   Tobacco Use  . Smoking status: Never Smoker  . Smokeless tobacco: Never Used  Substance Use Topics  . Alcohol use: No  . Drug use: No     Review of Systems  Constitutional: No fever/chills Eyes: No visual changes. No discharge ENT: Positive for onset of new daily epistaxis. Cardiovascular: no chest pain. Respiratory: no cough. No SOB. Gastrointestinal: No abdominal pain.  No nausea, no vomiting. Musculoskeletal: Negative for musculoskeletal pain. Skin: Negative for rash, abrasions, lacerations, ecchymosis. Neurological: Negative for headaches, focal weakness or numbness. 10-point ROS otherwise negative.  ____________________________________________   PHYSICAL EXAM:  VITAL SIGNS: ED Triage Vitals [10/05/18 2004]  Enc Vitals  Group     BP      Pulse Rate (!) 111     Resp 16     Temp 98.6 F (37 C)     Temp Source Oral     SpO2 98 %     Weight 129 lb (58.5 kg)     Height  (1.448 m)     Head Circumference      Peak Flow      Pain Score 0     Pain Loc      Pain Edu?      Excl. in GC?      Constitutional: Alert and oriented. Well appearing and in no acute distress. Eyes: Conjunctivae are normal. PERRL. EOMI. Head: Atraumatic. ENT:      Ears:       Nose: No congestion/rhinnorhea.  Visualization of bilateral nares reveals significant erythema, mild edema.  There is scabbing over the Kiesselbach plexus bilaterally.  No active epistaxis at this time.      Mouth/Throat: Mucous membranes are moist.  Neck: No stridor.   Hematological/Lymphatic/Immunilogical: No cervical lymphadenopathy. Cardiovascular: Normal rate, regular rhythm. Normal S1 and S2.  Good peripheral circulation. Respiratory: Normal respiratory effort  without tachypnea or retractions. Lungs CTAB. Good air entry to the bases with no decreased or absent breath sounds. Musculoskeletal: Full range of motion to all extremities. No gross deformities appreciated. Neurologic:  Normal speech and language. No gross focal neurologic deficits are appreciated.  Skin:  Skin is warm, dry and intact. No rash noted.  No ecchymosis.  No purpura. Psychiatric: Mood and affect are normal. Speech and behavior are normal. Patient exhibits appropriate insight and judgement.   ____________________________________________   LABS (all labs ordered are listed, but only abnormal results are displayed)  Labs Reviewed  COMPREHENSIVE METABOLIC PANEL - Abnormal; Notable for the following components:      Result Value   Potassium 3.2 (*)    Glucose, Bld 102 (*)    All other components within normal limits  CBC WITH DIFFERENTIAL/PLATELET - Abnormal; Notable for the following components:   WBC 12.6 (*)    Platelets 416 (*)    Lymphs Abs 4.9 (*)    All other  components within normal limits   ____________________________________________  EKG   ____________________________________________  RADIOLOGY   No results found.  ____________________________________________    PROCEDURES  Procedure(s) performed:    Procedures    Medications  sodium chloride 0.9 % bolus 1,000 mL (1,000 mLs Intravenous New Bag/Given 10/05/18 2122)     ____________________________________________   INITIAL IMPRESSION / ASSESSMENT AND PLAN / ED COURSE  Pertinent labs & imaging results that were available during my care of the patient were reviewed by me and considered in my medical decision making (see chart for details).  Review of the [place] CSRS was performed in accordance of the [facility] prior to dispensing any controlled drugs.  Clinical Course as of Oct 04 2208  Sat Oct 05, 2018  2053 Patient presented to the emergency department with complaint of epistaxis.  This is new, daily for the past week.  Patient had seen primary care, been prescribed Afrin for nosebleeds.  She had 1 day of cessation nosebleeds before resuming today.  Of note, patient has been started on amitriptyline and had a recent increase in her dosing prior to onset of new daily epistaxis.  There is no indication of purpura.  No unexplained bruising.  No history of bleeding or clotting disorders.  With recent start and then increase of amitriptyline, there is concern for underlying thrombocytopenia.  At this time, bleeding is controlled and patient does have findings of inflammation of the nares.  No active treatment required.  Patient's labs will be checked at this time.   [JC]  2202 Patient's labs returned with reassuring results with no indication of thrombocytopenia   [JC]    Clinical Course User Index [JC] Kristel Durkee, Delorise Royals, PA-C          Patient's diagnosis is consistent with epistaxis.  Patient presented to the emergency department with a return of epistaxis.  She has had  daily epistaxis over the past week.  She was prescribed Afrin for nosebleed which did cause a cessation of symptoms for 1 day.  Patient had a return of epistaxis today.  This was controlled direct pressure.  No active bleeding in the emergency department.  Patient had been recently started on amitriptyline with an increase in her dosing directly prior to onset of epistaxis.  Patient was evaluated with labs to ensure no thrombocytopenia.  This returned with reassuring results.  At this time, patient is to continue Afrin for epistaxis as needed I will also prescribe Flonase for inflamed turbinates.  Patient is advised to follow-up with ENT for any return of epistaxis in case this should require cautery..  Patient is given ED precautions to return to the ED for any worsening or new symptoms.     ____________________________________________  FINAL CLINICAL IMPRESSION(S) / ED DIAGNOSES  Final diagnoses:  Epistaxis, recurrent      NEW MEDICATIONS STARTED DURING THIS VISIT:  ED Discharge Orders         Ordered    fluticasone (FLONASE) 50 MCG/ACT nasal spray  2 times daily     10/05/18 2210              This chart was dictated using voice recognition software/Dragon. Despite best efforts to proofread, errors can occur which can change the meaning. Any change was purely unintentional.    Racheal Patches, PA-C 10/05/18 2210    Sharman Cheek, MD 10/07/18 [phone]

## 2018-12-23 DIAGNOSIS — R51 Headache: Secondary | ICD-10-CM | POA: Diagnosis not present

## 2018-12-23 DIAGNOSIS — G43009 Migraine without aura, not intractable, without status migrainosus: Secondary | ICD-10-CM | POA: Insufficient documentation

## 2018-12-30 ENCOUNTER — Other Ambulatory Visit: Payer: Self-pay

## 2018-12-30 ENCOUNTER — Ambulatory Visit (INDEPENDENT_AMBULATORY_CARE_PROVIDER_SITE_OTHER): Payer: 59 | Admitting: Gastroenterology

## 2018-12-30 DIAGNOSIS — R12 Heartburn: Secondary | ICD-10-CM | POA: Diagnosis not present

## 2018-12-30 DIAGNOSIS — K581 Irritable bowel syndrome with constipation: Secondary | ICD-10-CM | POA: Diagnosis not present

## 2018-12-30 DIAGNOSIS — K5909 Other constipation: Secondary | ICD-10-CM

## 2018-12-30 MED ORDER — LINACLOTIDE 290 MCG PO CAPS
290.0000 ug | ORAL_CAPSULE | Freq: Every day | ORAL | 0 refills | Status: DC
Start: 2018-12-30 — End: 2019-04-28

## 2018-12-30 MED ORDER — OMEPRAZOLE 40 MG PO CPDR: 40.0000 mg | DELAYED_RELEASE_CAPSULE | Freq: Every day | ORAL | 1 refills | Status: DC

## 2018-12-30 NOTE — Progress Notes (Signed)
Lannette Donathohini Iva Posten, MD [address]  [address]  [place], [place] [address]  Main: [phone]  Fax: [phone]    Gastroenterology Consultation Tele Visit  Referring Provider:     McLean-Scocuzza, French Anaracy * Primary Care Physician:  McLean-Scocuzza, Pasty Spillersracy N, MD Primary Gastroenterologist:  Dr. Arlyss Repressohini R Matix Henshaw Reason for Consultation:     Chronic constipation and chronic GERD        HPI:   Dawn Stark is a 18 y.o. female referred by Dr. Judie GrieveMcLean-Scocuzza, Pasty Spillersracy N, MD  for consultation & management of chronic constipation and chronic GERD  Virtual Visit via Telephone Note  I connected with Dawn Stark on 12/30/18 at  9:30 AM EDT by telephone and verified that I am speaking with the correct person using two identifiers.   I discussed the limitations, risks, security and privacy concerns of performing an evaluation and management service by telephone and the availability of in person appointments. I also discussed with the patient that there may be a patient responsible charge related to this service. The patient expressed understanding and agreed to proceed.  Location of the Patient: Home  Location of the provider: Office  Persons participating in the visit: Patient and provider only  History of Present Illness: Patient has been taking Linzess 290 MCG daily for chronic constipation which works modestly but manageable.  She reports that she has been experiencing worsening of heartburn and Pepcid is not helping.  She is requesting a prescription medication.  She denies dysphagia, abdominal pain, bloating, nausea or vomiting, melena, rectal bleeding, weight loss   Past Medical History:  Diagnosis Date  . Blind    since age 18 y.o   . Central sleep apnea   . Constipation   . GERD (gastroesophageal reflux disease)   . Hydrocephalus (HCC)   . Hypertension   . Migraine   . Open-angle glaucoma   . Optic atrophy, both eyes   . Preterm delivery    25 weeks  . Seizures  (HCC)     Past Surgical History:  Procedure Laterality Date  . BRAIN SURGERY     And shunt placement  . CRAINOTOMY Left 01/2012   for dural biopsy  . EXCISION OF SUBGLOTIC CYST  01/1999  . FRONTAL BOLT PLACEMENT Left 02/2010  . 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 VENTRICULOPERITONEAL SHUNT Right 10/1998  . VENTRICULOPERITONEAL SHUNT  12/2005, 03/2006, 09/2009   REVISIONS    Prior to Admission medications   Medication Sig Start Date End Date Taking? Authorizing Provider  acetaminophen (TYLENOL) 325 MG tablet Take by mouth.   Yes [provider]  butalbital-acetaminophen-caffeine (FIORICET, ESGIC) 50-325-40 MG tablet butalbital-acetaminophen-caffeine 50 mg-325 mg-40 mg tablet   Yes [provider]  etonogestrel (NEXPLANON) 68 MG IMPL implant 68 mg. 12/22/16  Yes [provider]  famotidine (PEPCID) 20 MG tablet Take 20 mg by mouth 2 (two) times daily.   Yes [provider]  FIBER SELECT GUMMIES PO Take 1 tablet daily by mouth.   Yes [provider]  fluticasone (FLONASE) 50 MCG/ACT nasal spray Place 1 spray into both nostrils 2 (two) times daily. 10/05/18  Yes Cuthriell, Delorise RoyalsJonathan D, PA-C  Fremanezumab-vfrm (AJOVY) 225 MG/1.5ML SOAJ Inject into the skin. 12/23/18  Yes [provider]  ibuprofen (ADVIL,MOTRIN) 800 MG tablet Take by mouth.   Yes [provider]  metoprolol succinate (TOPROL-XL) 50 MG 24 hr tablet Take 1 tablet (50 mg total) by mouth at bedtime. 09/14/17  Yes McLean-Scocuzza,  Nino Glow, MD  nortriptyline (PAMELOR) 25 MG capsule Take 1 capsule (25 mg total) by mouth at bedtime. 09/14/17  Yes McLean-Scocuzza, Nino Glow, MD  oxymetazoline (AFRIN NASAL SPRAY) 0.05 % nasal spray Place 1 spray into both nostrils 2 (two) times daily as needed (for nose bleed). 10/03/18  Yes Guse, Jacquelynn Cree, FNP  propranolol (INDERAL) 10 MG tablet Take 1 tablet (10 mg total) by mouth 3 (three) times daily as needed. 03/07/18  Yes Minna Merritts,  MD  linaclotide (LINZESS) 290 MCG CAPS capsule Take 1 capsule (290 mcg total) by mouth daily. Do not crush/chew 03/19/18 10/05/18  Lin Landsman, MD    Family History  Problem Relation Age of Onset  . Heart attack Father   . Heart disease Father      Social History   Tobacco Use  . Smoking status: Never Smoker  . Smokeless tobacco: Never Used  Substance Use Topics  . Alcohol use: No  . Drug use: No    Allergies as of 12/30/2018 - Review Complete 12/30/2018  Allergen Reaction Noted  . Ativan [lorazepam] Other (See Comments) 05/06/2016  . Latex Other (See Comments) 12/18/2017     Imaging Studies: Reviewed  Assessment and Plan:   Dawn Stark is a 18 y.o. 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 Most likely idiopathic or slow transit secondary to underlying neurologic problem. TSH was normal in the past.   Continue Linzess 290 MCG daily Trial of Prilosec 40 mg daily for GERD   Follow Up Instructions:   I discussed the assessment and treatment plan with the patient. The patient was provided an opportunity to ask questions and all were answered. The patient agreed with the plan and demonstrated an understanding of the instructions.   The patient was advised to call back or seek an in-person evaluation if the symptoms worsen or if the condition fails to improve as anticipated.  I provided 10 minutes of non-face-to-face time during this encounter.   Follow up in 6 months   Cephas Darby, MD

## 2019-02-04 ENCOUNTER — Other Ambulatory Visit: Payer: Self-pay | Admitting: Gastroenterology

## 2019-02-04 DIAGNOSIS — K5909 Other constipation: Secondary | ICD-10-CM

## 2019-02-04 DIAGNOSIS — K581 Irritable bowel syndrome with constipation: Secondary | ICD-10-CM

## 2019-02-25 DIAGNOSIS — R51 Headache: Secondary | ICD-10-CM | POA: Diagnosis not present

## 2019-02-25 DIAGNOSIS — H543 Unqualified visual loss, both eyes: Secondary | ICD-10-CM | POA: Diagnosis not present

## 2019-02-25 DIAGNOSIS — Z79899 Other long term (current) drug therapy: Secondary | ICD-10-CM | POA: Diagnosis not present

## 2019-02-25 DIAGNOSIS — Z982 Presence of cerebrospinal fluid drainage device: Secondary | ICD-10-CM | POA: Diagnosis not present

## 2019-02-25 DIAGNOSIS — Z888 Allergy status to other drugs, medicaments and biological substances status: Secondary | ICD-10-CM | POA: Diagnosis not present

## 2019-02-25 DIAGNOSIS — H540X33 Blindness right eye category 3, blindness left eye category 3: Secondary | ICD-10-CM | POA: Diagnosis not present

## 2019-02-25 DIAGNOSIS — R569 Unspecified convulsions: Secondary | ICD-10-CM | POA: Diagnosis not present

## 2019-02-25 DIAGNOSIS — I10 Essential (primary) hypertension: Secondary | ICD-10-CM | POA: Diagnosis not present

## 2019-02-25 DIAGNOSIS — H5710 Ocular pain, unspecified eye: Secondary | ICD-10-CM | POA: Diagnosis not present

## 2019-02-25 DIAGNOSIS — G911 Obstructive hydrocephalus: Secondary | ICD-10-CM | POA: Diagnosis not present

## 2019-02-25 DIAGNOSIS — D72829 Elevated white blood cell count, unspecified: Secondary | ICD-10-CM | POA: Diagnosis not present

## 2019-02-25 DIAGNOSIS — G43909 Migraine, unspecified, not intractable, without status migrainosus: Secondary | ICD-10-CM | POA: Diagnosis not present

## 2019-02-25 DIAGNOSIS — Z8249 Family history of ischemic heart disease and other diseases of the circulatory system: Secondary | ICD-10-CM | POA: Diagnosis not present

## 2019-04-02 DIAGNOSIS — R51 Headache: Secondary | ICD-10-CM | POA: Diagnosis not present

## 2019-04-02 DIAGNOSIS — G44221 Chronic tension-type headache, intractable: Secondary | ICD-10-CM | POA: Diagnosis not present

## 2019-04-28 ENCOUNTER — Telehealth: Payer: Self-pay | Admitting: Gastroenterology

## 2019-04-28 DIAGNOSIS — K5909 Other constipation: Secondary | ICD-10-CM

## 2019-04-28 DIAGNOSIS — K581 Irritable bowel syndrome with constipation: Secondary | ICD-10-CM

## 2019-04-28 MED ORDER — LINACLOTIDE 290 MCG PO CAPS: 290.0000 ug | ORAL_CAPSULE | Freq: Every day | ORAL | 0 refills | Status: DC

## 2019-04-28 NOTE — Telephone Encounter (Signed)
Last office visit 12/30/2018 chronic constipation Heart burn IBS  Last refill 12/30/2018 0 refills  Has appointment 07/01/19

## 2019-04-28 NOTE — Telephone Encounter (Signed)
Patient is calling for a refill on linaclotide (LINZESS) 290 MCG CAPS out of refills. Please call into the [facility].

## 2019-04-29 ENCOUNTER — Telehealth: Payer: Self-pay | Admitting: Internal Medicine

## 2019-04-29 NOTE — Telephone Encounter (Signed)
Pt mother called and stated that she would like a call back from the nurse regarding pt. Chest has a lump on chest  and it is painful.  Please advise

## 2019-04-30 NOTE — Telephone Encounter (Signed)
Left message for patients guardian to return call back. [facility] may give and obtain information.

## 2019-04-30 NOTE — Telephone Encounter (Signed)
I think she needs to go to urgent care or ED to evaluated  If abnormal f/u with neurosurgery and related to VP shunt will need to f/u neurosurgery   [place]

## 2019-04-30 NOTE — Telephone Encounter (Signed)
Returned patient's mother's call.  Pt's mother said that  patient has a lump in the middle of the chest since 4 days ago. Patient c/o lump being painful.  Size of a 50 cent piece coin.  Patient said it is extremely painful to touch.  No discharge.  No other symptoms.  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  Patient has not contacted her neurologist yet but plans on contacting neurolgist as well.  Best CB number is [phone].  Please advise.

## 2019-07-01 ENCOUNTER — Ambulatory Visit: Payer: 59 | Admitting: Gastroenterology

## 2019-09-05 ENCOUNTER — Telehealth: Payer: Self-pay | Admitting: Internal Medicine

## 2019-09-05 NOTE — Telephone Encounter (Signed)
Please advise 

## 2019-09-05 NOTE — Telephone Encounter (Signed)
Mother called and said pt has headache with pressure behind eyes. She can't get in with neurologist until the 24th. Mother would like someone to call her back to see what can be done. I told her Dr. French Ana has availiablity Tues 09/09/19 but she said something needs to be done today. Please call pt's mother.

## 2019-09-08 NOTE — Telephone Encounter (Signed)
Rec she take her daughter to the ED asap   TMS

## 2019-09-08 NOTE — Telephone Encounter (Signed)
Called and informed patient's mother. She verbalized understanding.

## 2019-10-11 ENCOUNTER — Ambulatory Visit: Payer: 59

## 2019-10-21 ENCOUNTER — Other Ambulatory Visit: Payer: Self-pay | Admitting: Gastroenterology

## 2019-10-21 DIAGNOSIS — G44221 Chronic tension-type headache, intractable: Secondary | ICD-10-CM | POA: Diagnosis not present

## 2019-10-21 DIAGNOSIS — K581 Irritable bowel syndrome with constipation: Secondary | ICD-10-CM

## 2019-10-21 DIAGNOSIS — K5909 Other constipation: Secondary | ICD-10-CM

## 2019-10-21 DIAGNOSIS — Z982 Presence of cerebrospinal fluid drainage device: Secondary | ICD-10-CM | POA: Diagnosis not present

## 2019-10-21 DIAGNOSIS — R519 Headache, unspecified: Secondary | ICD-10-CM | POA: Diagnosis not present

## 2019-10-21 DIAGNOSIS — M545 Low back pain: Secondary | ICD-10-CM | POA: Diagnosis not present

## 2019-11-10 ENCOUNTER — Ambulatory Visit: Payer: 59 | Attending: Internal Medicine

## 2019-11-10 ENCOUNTER — Other Ambulatory Visit: Payer: Self-pay

## 2019-11-10 DIAGNOSIS — Z23 Encounter for immunization: Secondary | ICD-10-CM

## 2019-11-10 NOTE — Progress Notes (Signed)
   Covid-19 Vaccination Clinic  Name:  Dawn Stark    MRN: [id number] DOB: [birth year]  11/10/2019  [name] was observed post Covid-19 immunization for 15 minutes without incident. She was provided with Vaccine Information Sheet and instruction to access the V-Safe system.   [name] was instructed to call 911 with any severe reactions post vaccine: Marland Kitchen Difficulty breathing  . Swelling of face and throat  . A fast heartbeat  . A bad rash all over body  . Dizziness and weakness   Immunizations Administered    Name Date Dose VIS Date Route   Pfizer COVID-19 Vaccine 11/10/2019  6:54 PM 0.3 mL 09/17/2018 Intramuscular   Manufacturer: [employer], [employer]   Lot: [id number]   NDC: [address]-9

## 2019-11-21 ENCOUNTER — Ambulatory Visit: Payer: 59

## 2019-12-03 ENCOUNTER — Ambulatory Visit: Payer: 59 | Attending: Internal Medicine

## 2019-12-03 DIAGNOSIS — Z23 Encounter for immunization: Secondary | ICD-10-CM

## 2019-12-03 NOTE — Progress Notes (Signed)
   Covid-19 Vaccination Clinic  Name:  Dawn Stark    MRN: [id number] DOB: [birth year]  12/03/2019  Dawn Stark was observed post Covid-19 immunization for 15 minutes without incident. She was provided with Vaccine Information Sheet and instruction to access the V-Safe system.   Dawn Stark was instructed to call 911 with any severe reactions post vaccine: Marland Kitchen Difficulty breathing  . Swelling of face and throat  . A fast heartbeat  . A bad rash all over body  . Dizziness and weakness   Immunizations Administered    Name Date Dose VIS Date Route   Pfizer COVID-19 Vaccine 12/03/2019  4:31 PM 0.3 mL 09/17/2018 Intramuscular   Manufacturer: [employer], [employer]   Lot: [id number]   NDC: [address]-0

## 2019-12-15 ENCOUNTER — Other Ambulatory Visit: Payer: Self-pay

## 2019-12-16 ENCOUNTER — Encounter (INDEPENDENT_AMBULATORY_CARE_PROVIDER_SITE_OTHER): Payer: Self-pay

## 2019-12-16 ENCOUNTER — Encounter: Payer: Self-pay | Admitting: Gastroenterology

## 2019-12-16 ENCOUNTER — Ambulatory Visit (INDEPENDENT_AMBULATORY_CARE_PROVIDER_SITE_OTHER): Payer: 59 | Admitting: Gastroenterology

## 2019-12-16 ENCOUNTER — Other Ambulatory Visit: Payer: Self-pay

## 2019-12-16 VITALS — BP 103/71 | HR 98 | Temp 98.1°F | Ht <= 58 in | Wt 122.5 lb

## 2019-12-16 DIAGNOSIS — K581 Irritable bowel syndrome with constipation: Secondary | ICD-10-CM

## 2019-12-16 DIAGNOSIS — K219 Gastro-esophageal reflux disease without esophagitis: Secondary | ICD-10-CM

## 2019-12-16 DIAGNOSIS — R1013 Epigastric pain: Secondary | ICD-10-CM

## 2019-12-16 DIAGNOSIS — K5909 Other constipation: Secondary | ICD-10-CM

## 2019-12-16 MED ORDER — LINACLOTIDE 290 MCG PO CAPS: 290.0000 ug | ORAL_CAPSULE | Freq: Every day | ORAL | 2 refills | Status: DC

## 2019-12-16 MED ORDER — NA SULFATE-K SULFATE-MG SULF 17.5-3.13-1.6 GM/177ML PO SOLN
354.0000 mL | Freq: Once | ORAL | 0 refills | Status: AC
Start: 2019-12-16 — End: 2019-12-16

## 2019-12-16 MED ORDER — MAGNESIUM CITRATE PO SOLN
1.0000 | Freq: Once | ORAL | 0 refills | Status: AC
Start: 2019-12-16 — End: 2019-12-16

## 2019-12-16 NOTE — Progress Notes (Signed)
[name], MD [address]  [address]  [place], [place] [address]  Main: [phone]  Fax: [phone]    Gastroenterology Consultation  Referring Provider:     McLean-Scocuzza, French Ana * Primary Care Physician:  McLean-Scocuzza, Pasty Spillers, MD Primary Gastroenterologist:  Dr. [name] Reason for Consultation:     Chronic constipation        HPI:   Dawn Stark is a 19 y.o. y/o female referred by Dr. Judie Grieve, Pasty Spillers, MD  for consultation & management of Chronic constipation. This has started since age of 61. She has history of hydrocephalus status post shunt placement. She is legally blind and currently [place] [employer]. She is accompanied by her mom today. She has tried MiraLAX for several years prescribed by her pediatrician which does not help. She is also prescribed Dulcolax. She has a BM every one to 2 weeks which are hard in consistency, spends lot of time on commode associated with straining and burning, blood on wiping, feels like the hemorrhoids prolapse when she pushes hard, spontaneously reduce only partially after defecation, also reports bloating and lower abdominal discomfort. Her TSH was normal in the past. She is on Pepcid for GERD  X-Flaharty abdomen in the past revealed significant stool burden.  Follow-up visit 05/28/2017: She tried linaclotide 290 MCG daily and it worked for the first 2 weeks, she was having one to 2 bowel movements daily without straining and they were soft and formed. But for the last 2 weeks she thinks the affect has worn off. She has been taking MiraLAX at bedtime. She thinks adding fiber such as fiber gummies she tried in the past were helping. She still continues to take linaclotide in the morning before breakfast. Her last bowel movement was Saturday and feels like she completely emptied. Now her symptoms of bloating, and rectal discomfort have returned.  Follow-up visit 12/18/2017 She is accompanied  by her father today. She reports that her PCP changed in appetite to 145 MCG which is not emptying her bowels completely. She reports that she was having 4-5 loose bowel movements associated with complete emptying on higher dose. She is asking if she could go back to 290 MCG daily. She otherwise denies any complaints today  Follow-up visit 12/16/2019 Patient is here with her parents for follow-up of constipation.  Patient ran out of Linzess about 2 months ago.  Her constipation is worse.  She is hardly having any bowel movements, last bowel movement was more than a week ago.  She also reports significant abdominal bloating associated with upper abdominal discomfort, nocturnal heartburn.  She is taking omeprazole 40 mg before dinner.  She denies any rectal bleeding. She reports that Linzess 290 MCG was helping with constipation  She did not have any GI surgeries No family history of colon cancer or other GI malignancy  GI Procedures: None  Past Medical History:  Diagnosis Date  . Blind    since age 72 y.o   . Central sleep apnea   . Constipation   . GERD (gastroesophageal reflux disease)   . Hydrocephalus (HCC)   . Hypertension   . Migraine   . Open-angle glaucoma   . Optic atrophy, both eyes   . Preterm delivery    25 weeks  . Seizures (HCC)     Past Surgical History:  Procedure Laterality Date  . BRAIN SURGERY     And shunt placement  . CRAINOTOMY Left 01/2012  for dural biopsy  . EXCISION OF SUBGLOTIC CYST  01/1999  . FRONTAL BOLT PLACEMENT Left 02/2010  . 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 VENTRICULOPERITONEAL SHUNT Right 10/1998  . VENTRICULOPERITONEAL SHUNT  12/2005, 03/2006, 09/2009   REVISIONS    Current Outpatient Medications:  .  acetaminophen (TYLENOL) 325 MG tablet, Take by mouth., Disp: , Rfl:  .  butalbital-acetaminophen-caffeine (FIORICET, ESGIC) 50-325-40 MG tablet, butalbital-acetaminophen-caffeine 50 mg-325 mg-40 mg tablet, Disp: , Rfl:  .  etonogestrel  (NEXPLANON) 68 MG IMPL implant, 68 mg., Disp: , Rfl:  .  famotidine (PEPCID) 20 MG tablet, Take 20 mg by mouth 2 (two) times daily., Disp: , Rfl:  .  FIBER SELECT GUMMIES PO, Take 1 tablet daily by mouth., Disp: , Rfl:  .  fluticasone (FLONASE) 50 MCG/ACT nasal spray, Place 1 spray into both nostrils 2 (two) times daily., Disp: 16 g, Rfl: 0 .  Fremanezumab-vfrm (AJOVY) 225 MG/1.5ML SOAJ, Inject into the skin., Disp: , Rfl:  .  ibuprofen (ADVIL,MOTRIN) 800 MG tablet, Take by mouth., Disp: , Rfl:  .  linaclotide (LINZESS) 290 MCG CAPS capsule, Take 1 capsule (290 mcg total) by mouth daily. Do not crush/chew, Disp: 90 capsule, Rfl: 2 .  metoprolol succinate (TOPROL-XL) 50 MG 24 hr tablet, Take 1 tablet (50 mg total) by mouth at bedtime., Disp: 90 tablet, Rfl: 1 .  nortriptyline (PAMELOR) 25 MG capsule, Take 1 capsule (25 mg total) by mouth at bedtime., Disp: 90 capsule, Rfl: 1 .  omeprazole (PRILOSEC) 40 MG capsule, Take 1 capsule (40 mg total) by mouth daily before breakfast for 30 days., Disp: 30 capsule, Rfl: 1 .  oxymetazoline (AFRIN NASAL SPRAY) 0.05 % nasal spray, Place 1 spray into both nostrils 2 (two) times daily as needed (for nose bleed)., Disp: 30 mL, Rfl: 1 .  propranolol (INDERAL) 10 MG tablet, Take 1 tablet (10 mg total) by mouth 3 (three) times daily as needed., Disp: 90 tablet, Rfl: 3 .  EMGALITY 120 MG/ML SOAJ, , Disp: , Rfl:  .  magnesium citrate SOLN, Take 296 mLs (1 Bottle total) by mouth once for 1 dose., Disp: 195 mL, Rfl: 0 .  Na Sulfate-K Sulfate-Mg Sulf 17.5-3.13-1.6 GM/177ML SOLN, Take 354 mLs by mouth once for 1 dose., Disp: 354 mL, Rfl: 0    Family History  Problem Relation Age of Onset  . Heart attack Father   . Heart disease Father      Social History   Tobacco Use  . Smoking status: Never Smoker  . Smokeless tobacco: Never Used  Substance Use Topics  . Alcohol use: No  . Drug use: No    Allergies as of 12/16/2019 - Review Complete 12/16/2019  Allergen  Reaction Noted  . Ativan [lorazepam] Other (See Comments) 05/06/2016  . Latex Other (See Comments) 12/18/2017    Review of Systems:    All systems reviewed and negative except where noted in HPI.   Physical Exam:  BP 103/71 (BP Location: Left Arm, Patient Position: Sitting, Cuff Size: Normal)   Pulse 98   Temp 98.1 F (36.7 C) (Oral)   Ht 4\' 9"  (1.448 m)   Wt 122 lb 8 oz (55.6 kg)   BMI 26.51 kg/m  No LMP recorded. Patient has had an implant.  General:   Alert,  Well-developed, well-nourished, pleasant and cooperative in NAD Head:  Normocephalic and atraumatic. Eyes:  Sclera clear, no icterus.   Conjunctiva pink. Ears:  Normal auditory acuity. Nose:  No deformity, discharge,  or lesions. Mouth:  No deformity or lesions,oropharynx pink & moist. Neck:  Supple; no masses or thyromegaly. Lungs:  Respirations even and unlabored.  Clear throughout to auscultation.   No wheezes, crackles, or rhonchi. No acute distress. Heart:  Regular rate and rhythm; no murmurs, clicks, rubs, or gallops. Abdomen:  Normal bowel sounds.  No bruits.  Soft, moderately distended, nontender, without masses, hepatosplenomegaly or hernias noted.  No guarding or rebound tenderness.   Rectal: Nor performed Msk:  Symmetrical without gross deformities. Good, equal movement & strength bilaterally. Pulses:  Normal pulses noted. Extremities:  No clubbing or edema.  No cyanosis. Neurologic:  Alert and oriented x3;  grossly normal neurologically. Skin:  Intact without significant lesions or rashes. No jaundice. Psych:  Alert and cooperative. Normal mood and affect.  Imaging Studies: No recent abdominal imaging other than plain x-Lehnert abdomen  Assessment and Plan:   SHAKENDRA GRIFFETH is a 19 y.o. 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 Most likely idiopathic or slow transit secondary to underlying neurologic problem. TSH was  normal in the past.   Chronic GERD/dyspepsia Recommend EGD for further evaluation Discussed about antireflux lifestyle Continue Meprazole 40 mg daily before dinner  Chronic constipation Restart linaclotide 290 MCG before breakfast Recheck labs Discussed about high-fiber diet, trial of Kiwi fruit twice daily Rule out cystic fibrosis, test ordered Recommend colonoscopy with 2-day prep  Follow-up in 1 to 2 months   [name], MD

## 2019-12-17 LAB — CFTR DELETION/DUPLICATION

## 2019-12-18 ENCOUNTER — Telehealth: Payer: Self-pay | Admitting: Internal Medicine

## 2019-12-18 NOTE — Telephone Encounter (Signed)
Okay for referral?

## 2019-12-18 NOTE — Telephone Encounter (Signed)
Pt's mother states that pt needs a referral to [facility] clinic OBGYN to get etonogestrel (NEXPLANON) 68 MG IMPL implant out of arm. Casandra Doffing phone number [phone]

## 2019-12-19 NOTE — Addendum Note (Signed)
Addended by: Quentin Ore on: 12/19/2019 07:01 PM   Modules accepted: Orders

## 2019-12-19 NOTE — Telephone Encounter (Signed)
Call mom Verlon Au   I placed referral typically for referral she has to be seen  Ob/gyn Dr. Dalbert Garnet   I have not seen her since 08/2017 and rec at least yearly f/u please schedule annual with me

## 2019-12-23 ENCOUNTER — Other Ambulatory Visit: Payer: Self-pay

## 2019-12-25 ENCOUNTER — Encounter: Payer: Self-pay | Admitting: Internal Medicine

## 2019-12-25 ENCOUNTER — Other Ambulatory Visit: Payer: Self-pay

## 2019-12-25 ENCOUNTER — Ambulatory Visit (INDEPENDENT_AMBULATORY_CARE_PROVIDER_SITE_OTHER): Payer: 59

## 2019-12-25 ENCOUNTER — Ambulatory Visit (INDEPENDENT_AMBULATORY_CARE_PROVIDER_SITE_OTHER): Payer: 59 | Admitting: Internal Medicine

## 2019-12-25 VITALS — BP 100/60 | HR 85 | Temp 97.3°F | Ht <= 58 in | Wt 118.6 lb

## 2019-12-25 DIAGNOSIS — H543 Unqualified visual loss, both eyes: Secondary | ICD-10-CM | POA: Diagnosis not present

## 2019-12-25 DIAGNOSIS — G43709 Chronic migraine without aura, not intractable, without status migrainosus: Secondary | ICD-10-CM | POA: Diagnosis not present

## 2019-12-25 DIAGNOSIS — Z0000 Encounter for general adult medical examination without abnormal findings: Secondary | ICD-10-CM | POA: Insufficient documentation

## 2019-12-25 DIAGNOSIS — E559 Vitamin D deficiency, unspecified: Secondary | ICD-10-CM

## 2019-12-25 DIAGNOSIS — Z1389 Encounter for screening for other disorder: Secondary | ICD-10-CM | POA: Diagnosis not present

## 2019-12-25 DIAGNOSIS — K5909 Other constipation: Secondary | ICD-10-CM

## 2019-12-25 DIAGNOSIS — R1084 Generalized abdominal pain: Secondary | ICD-10-CM

## 2019-12-25 DIAGNOSIS — Z982 Presence of cerebrospinal fluid drainage device: Secondary | ICD-10-CM

## 2019-12-25 DIAGNOSIS — IMO0002 Reserved for concepts with insufficient information to code with codable children: Secondary | ICD-10-CM

## 2019-12-25 DIAGNOSIS — Z1329 Encounter for screening for other suspected endocrine disorder: Secondary | ICD-10-CM

## 2019-12-25 DIAGNOSIS — K5900 Constipation, unspecified: Secondary | ICD-10-CM | POA: Diagnosis not present

## 2019-12-25 NOTE — Patient Instructions (Addendum)
Consider Nurtec   Will refer Dr. Dorcas Mcmurray and Dr. Marcell Barlow   Rimegepant oral dissolving tablet What is this medicine? RIMEGEPANT (ri ME je pant) is used to treat migraine headaches with or without aura. An aura is a strange feeling or visual disturbance that warns you of an attack. It is not used to prevent migraines. This medicine may be used for other purposes; ask your health care provider or pharmacist if you have questions. COMMON BRAND NAME(S): NURTEC ODT What should I tell my health care provider before I take this medicine? They need to know if you have any of these conditions:  kidney disease  liver disease  an unusual or allergic reaction to rimegepant, other medicines, foods, dyes, or preservatives  pregnant or trying to get pregnant  breast-feeding How should I use this medicine? Take the medicine by mouth. Follow the directions on the prescription label. Leave the tablet in the sealed blister pack until you are ready to take it. With dry hands, open the blister and gently remove the tablet. If the tablet breaks or crumbles, throw it away and take a new tablet out of the blister pack. Place the tablet in the mouth and allow it to dissolve, and then swallow. Do not cut, crush, or chew this medicine. You do not need water to take this medicine. Talk to your pediatrician about the use of this medicine in children. Special care may be needed. Overdosage: If you think you have taken too much of this medicine contact a poison control center or emergency room at once. NOTE: This medicine is only for you. Do not share this medicine with others. What if I miss a dose? This does not apply. This medicine is not for regular use. What may interact with this medicine? This medicine may interact with the following medications:  certain medicines for fungal infections like fluconazole, itraconazole  rifampin This list may not describe all possible interactions. Give your health care  provider a list of all the medicines, herbs, non-prescription drugs, or dietary supplements you use. Also tell them if you smoke, drink alcohol, or use illegal drugs. Some items may interact with your medicine. What should I watch for while using this medicine? Visit your health care professional for regular checks on your progress. Tell your health care professional if your symptoms do not start to get better or if they get worse. What side effects may I notice from receiving this medicine? Side effects that you should report to your doctor or health care professional as soon as possible:  allergic reactions like skin rash, itching or hives; swelling of the face, lips, or tongue Side effects that usually do not require medical attention (report these to your doctor or health care professional if they continue or are bothersome):  nausea This list may not describe all possible side effects. Call your doctor for medical advice about side effects. You may report side effects to FDA at 1-800-FDA-1088. Where should I keep my medicine? Keep out of the reach of children. Store at room temperature between 15 and 30 degrees C (59 and 86 degrees F). Throw away any unused medicine after the expiration date. NOTE: This sheet is a summary. It may not cover all possible information. If you have questions about this medicine, talk to your doctor, pharmacist, or health care provider.  2020 Elsevier/Gold Standard (2018-09-23 00:21:31)

## 2019-12-25 NOTE — Progress Notes (Signed)
Chief Complaint  Patient presents with   Annual Exam   Annual with mom leslie  1. Chronic constipation failed linzess 290 and miralax, colace  in the past colonoscopy Dr. Marius Ditch sch 12/23/19. Last stool was today but chronically constipated and bloated  2. Chronic migraines s/p VP shunt seeing Dr. Melrose Nakayama failed emgality on Ajovy still having h/a and mom giving her a times fiorocet and BC Powder. No longer on nortriptyline 25 mg qhs nor inderal 10 mg did not help  Disc'ed referral NS mom wants to see Dr. Daun Peacock given h/o VP shunt  Review of Systems  Constitutional: Negative for weight loss.  HENT: Negative for hearing loss.   Eyes: Negative for blurred vision.       +blind but can see light   Respiratory: Negative for shortness of breath.   Cardiovascular: Negative for chest pain.  Gastrointestinal: Positive for abdominal pain and constipation. Negative for blood in stool.  Musculoskeletal: Negative for falls.  Skin: Negative for rash.  Neurological: Negative for headaches.  Psychiatric/Behavioral: Negative for depression.   Past Medical History:  Diagnosis Date   Blind    since age 70 y.o    Central sleep apnea    Constipation    GERD (gastroesophageal reflux disease)    Hydrocephalus (HCC)    Hypertension    Migraine    Open-angle glaucoma    Optic atrophy, both eyes    Preterm delivery    25 weeks   Seizures ([place])    Past Surgical History:  Procedure Laterality Date   BRAIN SURGERY     And shunt placement   CRAINOTOMY Left 01/2012   for dural biopsy   EXCISION OF SUBGLOTIC CYST  01/1999   FRONTAL BOLT PLACEMENT Left 02/2010   SUB-GALEAL VENTRICULAR SHUNT  09/1998   VENTRICULOPERITONEAL SHUNT Right 10/1998   VENTRICULOPERITONEAL SHUNT  12/2005, 03/2006, 09/2009   REVISIONS   Family History  Problem Relation Age of Onset   Heart attack Father    Heart disease Father    Social History   Socioeconomic History   Marital status: Single     Spouse name: Not on file   Number of children: Not on file   Years of education: Not on file   Highest education level: Not on file  Occupational History   Not on file  Tobacco Use   Smoking status: Never Smoker   Smokeless tobacco: Never Used  Substance and Sexual Activity   Alcohol use: No   Drug use: No   Sexual activity: Never  Other Topics Concern   Not on file  Social History Narrative   [employer].    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   Likes to Radiation protection practitioner      Comes home on weekends   Social Determinants of Health   Financial Resource Strain:    Difficulty of Paying Living Expenses:   Food Insecurity:    Worried About Charity fundraiser in the Last Year:    Arboriculturist in the Last Year:   Transportation Needs:    Film/video editor (Medical):    Lack of Transportation (Non-Medical):   Physical Activity:    Days of Exercise per Week:    Minutes of Exercise per Session:   Stress:    Feeling of Stress :   Social Connections:    Frequency of Communication with Friends and Family:    Frequency of Social Gatherings with Friends and Family:    Attends Religious  Services:    Active Member of Clubs or Organizations:    Attends Engineer, structural:    Marital Status:   Intimate Partner Violence:    Fear of Current or Ex-Partner:    Emotionally Abused:    Physically Abused:    Sexually Abused:    Current Meds  Medication Sig   acetaminophen (TYLENOL) 325 MG tablet Take by mouth.   butalbital-acetaminophen-caffeine (FIORICET, ESGIC) 50-325-40 MG tablet butalbital-acetaminophen-caffeine 50 mg-325 mg-40 mg tablet   etonogestrel (NEXPLANON) 68 MG IMPL implant 68 mg.   famotidine (PEPCID) 20 MG tablet Take 20 mg by mouth 2 (two) times daily.   Fremanezumab-vfrm (AJOVY) 225 MG/1.5ML SOAJ Inject into the skin.   ibuprofen (ADVIL,MOTRIN) 800 MG tablet Take by mouth.   linaclotide (LINZESS) 290 MCG CAPS capsule Take 1 capsule  (290 mcg total) by mouth daily. Do not crush/chew   metoprolol succinate (TOPROL-XL) 50 MG 24 hr tablet Take 1 tablet (50 mg total) by mouth at bedtime.   Allergies  Allergen Reactions   Ativan [Lorazepam] Other (See Comments)    combative   Recent Results (from the past 2160 hour(s))  CFTR Deletion/Duplication     Status: None (Preliminary result)   Collection Time: 12/16/19  2:09 PM  Result Value Ref Range   CFTR Deletion/Duplication WILL FOLLOW   B12 and Folate Panel     Status: None   Collection Time: 12/16/19  2:09 PM  Result Value Ref Range   Vitamin B-12 254 232 - 1,245 pg/mL   Folate 7.0 >3.0 ng/mL    Comment: A serum folate concentration of less than 3.1 ng/mL is considered to represent clinical deficiency.   Iron, TIBC and Ferritin Panel     Status: None   Collection Time: 12/16/19  2:09 PM  Result Value Ref Range   Total Iron Binding Capacity 349 250 - 450 ug/dL   UIBC 222 979 - 892 ug/dL   Iron 119 27 - 417 ug/dL   Iron Saturation 30 15 - 55 %   Ferritin 92 15 - 150 ng/mL   Objective  Body mass index is 25.66 kg/m. Wt Readings from Last 3 Encounters:  12/25/19 118 lb 9.6 oz (53.8 kg)  12/16/19 122 lb 8 oz (55.6 kg)  10/05/18 129 lb (58.5 kg)   Temp Readings from Last 3 Encounters:  12/25/19 (!) 97.3 F (36.3 C) (Temporal)  12/16/19 98.1 F (36.7 C) (Oral)  10/05/18 97.8 F (36.6 C) (Oral)   BP Readings from Last 3 Encounters:  12/25/19 100/60  12/16/19 103/71  10/05/18 (!) 100/52   Pulse Readings from Last 3 Encounters:  12/25/19 85  12/16/19 98  10/05/18 (!) 104    Physical Exam Vitals and nursing note reviewed.  Constitutional:      Appearance: Normal appearance. She is well-developed and well-groomed.  HENT:     Head: Normocephalic and atraumatic.  Eyes:     Conjunctiva/sclera: Conjunctivae normal.     Pupils: Pupils are equal, round, and reactive to light.  Cardiovascular:     Rate and Rhythm: Normal rate and regular rhythm.      Heart sounds: Normal heart sounds. No murmur.  Pulmonary:     Effort: Pulmonary effort is normal.     Breath sounds: Normal breath sounds.  Abdominal:     General: There is distension.     Tenderness: There is abdominal tenderness in the right upper quadrant and right lower quadrant.  Skin:    General: Skin is warm  and dry.  Neurological:     General: No focal deficit present.     Mental Status: She is alert and oriented to person, place, and time. Mental status is at baseline.     Gait: Gait normal.  Psychiatric:        Attention and Perception: Attention and perception normal.        Mood and Affect: Mood and affect normal.        Speech: Speech normal.        Behavior: Behavior normal. Behavior is cooperative.        Thought Content: Thought content normal.        Cognition and Memory: Cognition and memory normal.        Judgment: Judgment normal.     Assessment  Plan  Annual physical exam -  Fasting labs today  Will need lipid when fasting in the future  Did not get flu shot 2020 utd covid vaccine Tdap had 12/15/09 will do at f/u or RN visit Check hep B titer in the future with labs prevnar utd  HPV utd  MCV utd  MMRV utd   Pending referral pap and exchange Nexplanon Dr. Dalbert Garnet  rec healthy diet and exercise   Chronic constipation - Plan: DG Abd 1 View Generalized abdominal pain - Plan: DG Abd 1 View F/u Dr. Allegra Lai colonoscopy sch 01/02/20  Failed miralax, colace and linzess 290  Consider trulance per GI  Blind in both eyes - Plan: Ambulatory referral to Ophthalmology [place]  S/P VP shunt with h/o chronic migraine - Plan: Ambulatory referral to Ophthalmology, Ambulatory referral to Neurosurgery Dr. Marcell Barlow F/u Dr. Malvin Johns disc nurtec today to consider if Ajovy does not help   Specialists:  Neuro:Dr. Malvin Johns  Ob/gyn: Dr. Dalbert Garnet GI Dr. Allegra Lai NS Dr. Desma Paganini Dr. Dorcas Mcmurray  Provider: Dr. French Ana McLean-Scocuzza-Internal Medicine

## 2019-12-26 LAB — URINALYSIS, ROUTINE W REFLEX MICROSCOPIC
Bilirubin Urine: NEGATIVE
Glucose, UA: NEGATIVE
Hgb urine dipstick: NEGATIVE
Leukocytes,Ua: NEGATIVE
Nitrite: NEGATIVE
Protein, ur: NEGATIVE
Specific Gravity, Urine: 1.028 (ref 1.001–1.03)
pH: 6.5 (ref 5.0–8.0)

## 2019-12-30 LAB — IRON,TIBC AND FERRITIN PANEL
Ferritin: 92 ng/mL (ref 15–150)
Iron Saturation: 30 % (ref 15–55)
Iron: 104 ug/dL (ref 27–159)
Total Iron Binding Capacity: 349 ug/dL (ref 250–450)
UIBC: 245 ug/dL (ref 131–425)

## 2019-12-30 LAB — B12 AND FOLATE PANEL
Folate: 7 ng/mL (ref >3.0)
Vitamin B-12: 254 pg/mL (ref 232–1245)

## 2019-12-30 LAB — REQUEST PROBLEM

## 2019-12-31 ENCOUNTER — Other Ambulatory Visit
Admission: RE | Admit: 2019-12-31 | Discharge: 2019-12-31 | Disposition: A | Discharge status: Home / Self Care | Payer: 59 | Source: Ambulatory Visit | Attending: Gastroenterology | Admitting: Gastroenterology

## 2019-12-31 ENCOUNTER — Other Ambulatory Visit: Payer: Self-pay

## 2019-12-31 DIAGNOSIS — Z01812 Encounter for preprocedural laboratory examination: Secondary | ICD-10-CM | POA: Diagnosis not present

## 2019-12-31 DIAGNOSIS — Z20822 Contact with and (suspected) exposure to covid-19: Secondary | ICD-10-CM | POA: Diagnosis not present

## 2019-12-31 LAB — SARS CORONAVIRUS 2 (TAT 6-24 HRS): SARS Coronavirus 2: NEGATIVE

## 2020-01-02 ENCOUNTER — Ambulatory Visit
Admission: RE | Admit: 2020-01-02 | Discharge: 2020-01-02 | Disposition: A | Discharge status: Home / Self Care | Payer: 59 | Attending: Gastroenterology | Admitting: Gastroenterology

## 2020-01-02 ENCOUNTER — Ambulatory Visit: Payer: 59 | Admitting: Anesthesiology

## 2020-01-02 ENCOUNTER — Encounter
Admission: RE | Disposition: A | Discharge status: Home / Self Care | Payer: Self-pay | Source: Home / Self Care | Attending: Gastroenterology

## 2020-01-02 ENCOUNTER — Other Ambulatory Visit: Payer: Self-pay

## 2020-01-02 ENCOUNTER — Encounter: Payer: Self-pay | Admitting: Gastroenterology

## 2020-01-02 DIAGNOSIS — G4731 Primary central sleep apnea: Secondary | ICD-10-CM | POA: Diagnosis not present

## 2020-01-02 DIAGNOSIS — R1013 Epigastric pain: Secondary | ICD-10-CM

## 2020-01-02 DIAGNOSIS — I10 Essential (primary) hypertension: Secondary | ICD-10-CM | POA: Insufficient documentation

## 2020-01-02 DIAGNOSIS — K219 Gastro-esophageal reflux disease without esophagitis: Secondary | ICD-10-CM

## 2020-01-02 DIAGNOSIS — R55 Syncope and collapse: Secondary | ICD-10-CM | POA: Diagnosis not present

## 2020-01-02 DIAGNOSIS — Z982 Presence of cerebrospinal fluid drainage device: Secondary | ICD-10-CM | POA: Insufficient documentation

## 2020-01-02 DIAGNOSIS — H548 Legal blindness, as defined in USA: Secondary | ICD-10-CM | POA: Insufficient documentation

## 2020-01-02 DIAGNOSIS — K254 Chronic or unspecified gastric ulcer with hemorrhage: Secondary | ICD-10-CM | POA: Insufficient documentation

## 2020-01-02 DIAGNOSIS — K319 Disease of stomach and duodenum, unspecified: Secondary | ICD-10-CM | POA: Insufficient documentation

## 2020-01-02 DIAGNOSIS — K5901 Slow transit constipation: Secondary | ICD-10-CM | POA: Diagnosis not present

## 2020-01-02 DIAGNOSIS — Z888 Allergy status to other drugs, medicaments and biological substances status: Secondary | ICD-10-CM | POA: Diagnosis not present

## 2020-01-02 DIAGNOSIS — K581 Irritable bowel syndrome with constipation: Secondary | ICD-10-CM

## 2020-01-02 DIAGNOSIS — K2100 Gastro-esophageal reflux disease with esophagitis, without bleeding: Secondary | ICD-10-CM | POA: Diagnosis not present

## 2020-01-02 DIAGNOSIS — Z8249 Family history of ischemic heart disease and other diseases of the circulatory system: Secondary | ICD-10-CM | POA: Diagnosis not present

## 2020-01-02 DIAGNOSIS — K2960 Other gastritis without bleeding: Secondary | ICD-10-CM | POA: Diagnosis not present

## 2020-01-02 DIAGNOSIS — Z79899 Other long term (current) drug therapy: Secondary | ICD-10-CM | POA: Diagnosis not present

## 2020-01-02 DIAGNOSIS — Z91040 Latex allergy status: Secondary | ICD-10-CM | POA: Insufficient documentation

## 2020-01-02 DIAGNOSIS — K259 Gastric ulcer, unspecified as acute or chronic, without hemorrhage or perforation: Secondary | ICD-10-CM | POA: Diagnosis not present

## 2020-01-02 DIAGNOSIS — K5909 Other constipation: Secondary | ICD-10-CM

## 2020-01-02 DIAGNOSIS — K922 Gastrointestinal hemorrhage, unspecified: Secondary | ICD-10-CM | POA: Diagnosis not present

## 2020-01-02 DIAGNOSIS — R569 Unspecified convulsions: Secondary | ICD-10-CM | POA: Insufficient documentation

## 2020-01-02 HISTORY — PX: COLONOSCOPY WITH PROPOFOL: SHX5780

## 2020-01-02 HISTORY — PX: ESOPHAGOGASTRODUODENOSCOPY (EGD) WITH PROPOFOL: SHX5813

## 2020-01-02 LAB — POCT PREGNANCY, URINE: Preg Test, Ur: NEGATIVE

## 2020-01-02 SURGERY — COLONOSCOPY WITH PROPOFOL
Anesthesia: General

## 2020-01-02 MED ORDER — PROPOFOL 10 MG/ML IV BOLUS
INTRAVENOUS | Status: DC | PRN
  Administered 2020-01-02: 30 mg via INTRAVENOUS
  Administered 2020-01-02: 10 mg via INTRAVENOUS
  Administered 2020-01-02: 40 mg via INTRAVENOUS

## 2020-01-02 MED ORDER — SODIUM CHLORIDE 0.9 % IV SOLN: INTRAVENOUS | Status: DC

## 2020-01-02 MED ORDER — LIDOCAINE HCL (CARDIAC) PF 100 MG/5ML IV SOSY
PREFILLED_SYRINGE | INTRAVENOUS | Status: DC | PRN
  Administered 2020-01-02: 50 mg via INTRAVENOUS

## 2020-01-02 MED ORDER — OMEPRAZOLE 40 MG PO CPDR: 40.0000 mg | DELAYED_RELEASE_CAPSULE | Freq: Two times a day (BID) | ORAL | 2 refills | Status: DC

## 2020-01-02 MED ORDER — DEXMEDETOMIDINE HCL IN NACL 200 MCG/50ML IV SOLN
INTRAVENOUS | Status: DC | PRN
Start: 2020-01-02 — End: 2020-01-02
  Administered 2020-01-02: 8 ug via INTRAVENOUS

## 2020-01-02 MED ORDER — PROPOFOL 500 MG/50ML IV EMUL
INTRAVENOUS | Status: DC | PRN
  Administered 2020-01-02: 150 ug/kg/min via INTRAVENOUS

## 2020-01-02 MED ORDER — PROPOFOL 500 MG/50ML IV EMUL
INTRAVENOUS | Status: AC
  Filled 2020-01-02: qty 50

## 2020-01-02 NOTE — Anesthesia Preprocedure Evaluation (Signed)
Anesthesia Evaluation  Patient identified by MRN, date of birth, ID band Patient awake    Reviewed: Allergy & Precautions, H&P , NPO status , Patient's Chart, lab work & pertinent test results, reviewed documented beta blocker date and time   Airway Mallampati: II   Neck ROM: full    Dental  (+) Poor Dentition   Pulmonary neg pulmonary ROS,    Pulmonary exam normal        Cardiovascular Exercise Tolerance: Good hypertension, On Medications negative cardio ROS Normal cardiovascular exam Rhythm:regular Rate:Normal     Neuro/Psych  Headaches, Seizures -, Well Controlled,  negative psych ROS   GI/Hepatic Neg liver ROS, GERD  Medicated,  Endo/Other  negative endocrine ROS  Renal/GU negative Renal ROS  negative genitourinary   Musculoskeletal   Abdominal   Peds  Hematology negative hematology ROS (+)   Anesthesia Other Findings Past Medical History: No date: Blind     Comment:  since age 19 y.o  No date: Central sleep apnea No date: Constipation No date: GERD (gastroesophageal reflux disease) No date: Hydrocephalus (HCC) No date: Hypertension No date: Migraine No date: Open-angle glaucoma No date: Optic atrophy, both eyes No date: Preterm delivery     Comment:  25 weeks No date: Seizures (HCC) Past Surgical History: No date: BRAIN SURGERY     Comment:  And shunt placement 01/2012: CRAINOTOMY; Left     Comment:  for dural biopsy 01/1999: EXCISION OF SUBGLOTIC CYST 02/2010: FRONTAL BOLT PLACEMENT; Left 09/1998: SUB-GALEAL VENTRICULAR SHUNT 10/1998: VENTRICULOPERITONEAL SHUNT; Right 12/2005, 03/2006, 09/2009: VENTRICULOPERITONEAL SHUNT     Comment:  REVISIONS BMI    Body Mass Index: 24.67 kg/m     Reproductive/Obstetrics negative OB ROS                             Anesthesia Physical Anesthesia Plan  ASA: III  Anesthesia Plan: General   Post-op Pain Management:     Induction:   PONV Risk Score and Plan:   Airway Management Planned:   Additional Equipment:   Intra-op Plan:   Post-operative Plan:   Informed Consent: I have reviewed the patients History and Physical, chart, labs and discussed the procedure including the risks, benefits and alternatives for the proposed anesthesia with the patient or authorized representative who has indicated his/her understanding and acceptance.     Dental Advisory Given  Plan Discussed with: CRNA  Anesthesia Plan Comments:         Anesthesia Quick Evaluation

## 2020-01-02 NOTE — Op Note (Signed)
[facility] Patient Name: Dawn Stark Procedure Date: 01/02/2020 10:44 AM MRN: [id number] Account #: [id number] Date of Birth: 04/24/2001 Admit Type: Outpatient Age: 19 Room: [facility] ENDO ROOM 3 Gender: Female Note Status: Finalized Procedure:             Upper GI endoscopy Indications:           Epigastric abdominal pain, Dyspepsia, Esophageal                         reflux symptoms that persist despite appropriate                         therapy Providers:             Lin Landsman MD, MD Referring MD:          Nino Glow Mclean-Scocuzza MD, MD (Referring MD) Medicines:             Monitored Anesthesia Care Complications:         No immediate complications. Estimated blood loss:                         Minimal. Procedure:             Pre-Anesthesia Assessment:                        - Prior to the procedure, a History and Physical was                         performed, and patient medications and allergies were                         reviewed. The patient is competent. The risks and                         benefits of the procedure and the sedation options and                         risks were discussed with the patient. All questions                         were answered and informed consent was obtained.                         Patient identification and proposed procedure were                         verified by the physician, the nurse, the                         anesthesiologist, the anesthetist and the technician                         in the pre-procedure area in the procedure room.                         Mental Status Examination: alert and oriented. Airway  Examination: normal oropharyngeal airway and neck                         mobility. Respiratory Examination: clear to                         auscultation. CV Examination: normal. Prophylactic                         Antibiotics: The patient does not  require prophylactic                         antibiotics. Prior Anticoagulants: The patient has                         taken no previous anticoagulant or antiplatelet agents                         except for NSAID medication. ASA Grade Assessment: III                         - A patient with severe systemic disease. After                         reviewing the risks and benefits, the patient was                         deemed in satisfactory condition to undergo the                         procedure. The anesthesia plan was to use monitored                         anesthesia care (MAC). Immediately prior to                         administration of medications, the patient was                         re-assessed for adequacy to receive sedatives. The                         heart rate, respiratory rate, oxygen saturations,                         blood pressure, adequacy of pulmonary ventilation, and                         response to care were monitored throughout the                         procedure. The physical status of the patient was                         re-assessed after the procedure.                        After obtaining informed consent, the endoscope was  passed under direct vision. Throughout the procedure,                         the patient's blood pressure, pulse, and oxygen                         saturations were monitored continuously. The Endoscope                         was introduced through the mouth, and advanced to the                         second part of duodenum. The upper GI endoscopy was                         accomplished without difficulty. The patient tolerated                         the procedure fairly well. Findings:      The duodenal bulb and second portion of the duodenum were normal.      Multiple dispersed small erosions with active bleeding were found in the       gastric body and in the gastric antrum.      One  non-bleeding superficial gastric ulcer with a clean ulcer base       (Forrest Class III) was found at the incisura. The lesion was 3 mm in       largest dimension. Random Biopsies were taken with a cold forceps for       Helicobacter pylori testing. To prevent bleeding after the biopsy, one       hemostatic clip was successfully placed (MR conditional). There was no       bleeding at the end of the procedure.      Esophagogastric landmarks were identified: the gastroesophageal junction       was found at 29 cm from the incisors.      The gastroesophageal junction and examined esophagus were normal. Impression:            - Normal duodenal bulb and second portion of the                         duodenum.                        - Erosive gastropathy with active bleeding.                        - Non-bleeding gastric ulcer with a clean ulcer base                         (Forrest Class III). Biopsied. Clip (MR conditional)                         was placed.                        - Esophagogastric landmarks identified.                        - Normal gastroesophageal junction and esophagus. Recommendation:        -  Await pathology results.                        - Use Prilosec (omeprazole) 40 mg PO BID for 3 months.                        - No ibuprofen, naproxen, or other non-steroidal                         anti-inflammatory drugs.                        - Return to my office as previously scheduled.                        - Proceed with colonoscopy as scheduled                        - See colonoscopy report Procedure Code(s):     --- Professional ---                        [phone], Esophagogastroduodenoscopy, flexible,                         transoral; with biopsy, single or multiple Diagnosis Code(s):     --- Professional ---                        K92.2, Gastrointestinal hemorrhage, unspecified                        K25.9, Gastric ulcer, unspecified as acute or chronic,                          without hemorrhage or perforation                        R10.13, Epigastric pain                        K21.9, Gastro-esophageal reflux disease without                         esophagitis CPT copyright 2019 American Medical Association. All rights reserved. The codes documented in this report are preliminary and upon coder review may  be revised to meet current compliance requirements. Dr. Ulyess Mort Lin Landsman MD, MD 01/02/2020 11:15:44 AM This report has been signed electronically. Number of Addenda: 0 Note Initiated On: 01/02/2020 10:44 AM Estimated Blood Loss:  Estimated blood loss was minimal.      [facility]

## 2020-01-02 NOTE — Anesthesia Procedure Notes (Signed)
Date/Time: 01/02/2020 10:56 AM Performed by: Ginger Carne, CRNA Pre-anesthesia Checklist: Patient identified, Emergency Drugs available, Suction available, Patient being monitored and Timeout performed Patient Re-evaluated:Patient Re-evaluated prior to induction Oxygen Delivery Method: Nasal cannula Preoxygenation: Pre-oxygenation with 100% oxygen Induction Type: IV induction

## 2020-01-02 NOTE — Op Note (Signed)
[facility] Patient Name: Dawn Stark Procedure Date: 01/02/2020 10:43 AM MRN: [id number] Account #: [id number] Date of Birth: 2000-08-23 Admit Type: Outpatient Age: 19 Room: [facility] ENDO ROOM 3 Gender: Female Note Status: Finalized Procedure:             Colonoscopy Indications:           This is the patient's first colonoscopy, Slow transit                         constipation Providers:             Lin Landsman MD, MD Referring MD:          Nino Glow Mclean-Scocuzza MD, MD (Referring MD) Medicines:             Monitored Anesthesia Care Complications:         No immediate complications. Estimated blood loss: None. Procedure:             Pre-Anesthesia Assessment:                        - Prior to the procedure, a History and Physical was                         performed, and patient medications and allergies were                         reviewed. The patient is competent. The risks and                         benefits of the procedure and the sedation options and                         risks were discussed with the patient. All questions                         were answered and informed consent was obtained.                         Patient identification and proposed procedure were                         verified by the physician, the nurse, the                         anesthesiologist, the anesthetist and the technician                         in the pre-procedure area in the procedure room in the                         endoscopy suite. Mental Status Examination: alert and                         oriented. Airway Examination: normal oropharyngeal                         airway and neck mobility. Respiratory Examination:  clear to auscultation. CV Examination: normal.                         Prophylactic Antibiotics: The patient does not require                         prophylactic antibiotics. Prior  Anticoagulants: The                         patient has taken no previous anticoagulant or                         antiplatelet agents. ASA Grade Assessment: III - A                         patient with severe systemic disease. After reviewing                         the risks and benefits, the patient was deemed in                         satisfactory condition to undergo the procedure. The                         anesthesia plan was to use monitored anesthesia care                         (MAC). Immediately prior to administration of                         medications, the patient was re-assessed for adequacy                         to receive sedatives. The heart rate, respiratory                         rate, oxygen saturations, blood pressure, adequacy of                         pulmonary ventilation, and response to care were                         monitored throughout the procedure. The physical                         status of the patient was re-assessed after the                         procedure.                        After obtaining informed consent, the colonoscope was                         passed under direct vision. Throughout the procedure,                         the patient's blood pressure, pulse, and oxygen  saturations were monitored continuously. The                         Colonoscope was introduced through the anus and                         advanced to the the terminal ileum, with                         identification of the appendiceal orifice and IC                         valve. The colonoscopy was performed without                         difficulty. The patient tolerated the procedure well.                         The quality of the bowel preparation was evaluated                         using the BBPS [facility] Bowel Preparation Scale) with                         scores of: Right Colon = 3, Transverse Colon = 3 and                          Left Colon = 3 (entire mucosa seen well with no                         residual staining, small fragments of stool or opaque                         liquid). The total BBPS score equals 9. Findings:      The perianal and digital rectal examinations were normal. Pertinent       negatives include normal sphincter tone and no palpable rectal lesions.      The colon (entire examined portion) appeared normal.      The terminal ileum appeared normal.      The retroflexed view of the distal rectum and anal verge was normal and       showed no anal or rectal abnormalities. Impression:            - The entire examined colon is normal.                        - The examined portion of the ileum was normal.                        - The distal rectum and anal verge are normal on                         retroflexion view.                        - No specimens collected. Recommendation:        - Discharge patient to home (with escort).                        -  Resume previous diet today.                        - Continue present medications.                        - Return to my office as previously scheduled. Procedure Code(s):     --- Professional ---                        [phone], Colonoscopy, flexible; diagnostic, including                         collection of specimen(s) by brushing or washing, when                         performed (separate procedure) Diagnosis Code(s):     --- Professional ---                        K59.01, Slow transit constipation CPT copyright 2019 American Medical Association. All rights reserved. The codes documented in this report are preliminary and upon coder review may  be revised to meet current compliance requirements. Dr. Ulyess Mort Lin Landsman MD, MD 01/02/2020 11:25:20 AM This report has been signed electronically. Number of Addenda: 0 Note Initiated On: 01/02/2020 10:43 AM Scope Withdrawal Time: 0 hours 3 minutes 44 seconds  Total Procedure  Duration: 0 hours 5 minutes 44 seconds  Estimated Blood Loss:  Estimated blood loss: none.      [facility]

## 2020-01-02 NOTE — Transfer of Care (Signed)
Immediate Anesthesia Transfer of Care Note  Patient: Dawn Stark  Procedure(s) Performed: COLONOSCOPY WITH PROPOFOL (N/A ) ESOPHAGOGASTRODUODENOSCOPY (EGD) WITH PROPOFOL (N/A )  Patient Location: PACU  Anesthesia Type:General  Level of Consciousness: sedated  Airway & Oxygen Therapy: Patient Spontanous Breathing  Post-op Assessment: Report given to RN and Post -op Vital signs reviewed and stable  Post vital signs: Reviewed and stable  Last Vitals:  Vitals Value Taken Time  BP 106/73 01/02/20 1132  Temp    Pulse 93 01/02/20 1132  Resp 12 01/02/20 1132  SpO2 100 % 01/02/20 1132    Last Pain:  Vitals:   01/02/20 0950  TempSrc: Temporal  PainSc: 0-No pain         Complications: No complications documented.

## 2020-01-02 NOTE — H&P (Signed)
Dawn Darby, MD [address]  [place]  [place], [place] [address]  Main: [phone]  Fax: [phone] Pager: [phone]  Primary Care Physician:  McLean-Scocuzza, Dawn Glow, MD Primary Gastroenterologist:  Dr. Cephas Stark  Pre-Procedure History & Physical: HPI:  Dawn Stark is a 19 y.o. female is here for an endoscopy and colonoscopy.   Past Medical History:  Diagnosis Date  . Blind    since age 5 y.o   . Central sleep apnea   . Constipation   . GERD (gastroesophageal reflux disease)   . Hydrocephalus ([place])   . Hypertension   . Migraine   . Open-angle glaucoma   . Optic atrophy, both eyes   . Preterm delivery    25 weeks  . Seizures ([place])     Past Surgical History:  Procedure Laterality Date  . BRAIN SURGERY     And shunt placement  . CRAINOTOMY Left 01/2012   for dural biopsy  . EXCISION OF SUBGLOTIC CYST  01/1999  . FRONTAL BOLT PLACEMENT Left 02/2010  . 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 VENTRICULOPERITONEAL SHUNT Right 10/1998  . VENTRICULOPERITONEAL SHUNT  12/2005, 03/2006, 09/2009   REVISIONS    Prior to Admission medications   Medication Sig Start Date End Date Taking? Authorizing Provider  butalbital-acetaminophen-caffeine (FIORICET, ESGIC) 50-325-40 MG tablet butalbital-acetaminophen-caffeine 50 mg-325 mg-40 mg tablet   Yes [provider]  acetaminophen (TYLENOL) 325 MG tablet Take by mouth.    [provider]  etonogestrel (NEXPLANON) 68 MG IMPL implant 68 mg. 12/22/16   [provider]  famotidine (PEPCID) 20 MG tablet Take 20 mg by mouth 2 (two) times daily.    [provider]  Fremanezumab-vfrm (AJOVY) 225 MG/1.5ML SOAJ Inject into the skin. 12/23/18   [provider]  ibuprofen (ADVIL,MOTRIN) 800 MG tablet Take by mouth.    [provider]  linaclotide (LINZESS) 290 MCG CAPS capsule Take 1 capsule (290 mcg total) by mouth daily. Do not crush/chew 12/16/19 03/15/20  Lin Landsman, MD  metoprolol succinate (TOPROL-XL) 50 MG 24 hr tablet Take 1 tablet (50 mg total) by mouth at bedtime. 09/14/17   McLean-Scocuzza, Dawn Glow, MD    Allergies as of 12/16/2019 - Review Complete 12/16/2019  Allergen Reaction Noted  . Ativan [lorazepam] Other (See Comments) 05/06/2016  . Latex Other (See Comments) 12/18/2017    Family History  Problem Relation Age of Onset  . Heart attack Father   . Heart disease Father     Social History   Socioeconomic History  . Marital status: Single    Spouse name: Not on file  . Number of children: Not on file  . Years of education: Not on file  . Highest education level: Not on file  Occupational History  . Not on file  Tobacco Use  . Smoking status: Never Smoker  . Smokeless tobacco: Never Used  Vaping Use  . Vaping Use: Never used  Substance and Sexual Activity  . Alcohol use: No  . Drug use: No  . Sexual activity: Never  Other Topics Concern  . Not on file  Social History Narrative   [place].    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 Difficulty of Paying Living Expenses:   Food Insecurity:   . Worried About Charity fundraiser  in the Last Year:   . Ran Out of Food in the Last Year:   Transportation Needs:   . Freight forwarder (Medical):   Marland Kitchen Lack of Transportation (Non-Medical):   Physical Activity:   . Days of Exercise per Week:   . Minutes of Exercise per Session:   Stress:   . Feeling of Stress :   Social Connections:   . Frequency of Communication with Friends and Family:   . Frequency of Social Gatherings with Friends and Family:   . Attends Religious Services:   . Active Member of Clubs or Organizations:   . Attends Banker Meetings:   Marland Kitchen Marital Status:   Intimate Partner Violence:   . Fear of Current or Ex-Partner:   . Emotionally Abused:   Marland Kitchen Physically Abused:   .  Sexually Abused:     Review of Systems: See HPI, otherwise negative ROS  Physical Exam: BP 112/78   Pulse 79   Temp (!) 97.4 F (36.3 C) (Temporal)   Resp 16   Ht 4\' 9"  (1.448 m)   Wt 51.7 kg   LMP 06/04/2019   SpO2 100%   BMI 24.67 kg/m  General:   Alert,  pleasant and cooperative in NAD Head:  Normocephalic and atraumatic. Neck:  Supple; no masses or thyromegaly. Lungs:  Clear throughout to auscultation.    Heart:  Regular rate and rhythm. Abdomen:  Soft, nontender and nondistended. Normal bowel sounds, without guarding, and without rebound.   Neurologic:  Alert and  oriented x4;  grossly normal neurologically.  Impression/Plan: 13/05/2019 Dawn Stark is here for an endoscopy and colonoscopy to be performed for Chronic GERD/dyspepsia, Chronic constipation  Risks, benefits, limitations, and alternatives regarding  endoscopy and colonoscopy have been reviewed with the patient.  Questions have been answered.  All parties agreeable.   Stephannie Peters, MD  01/02/2020, 10:36 AM

## 2020-01-04 NOTE — Anesthesia Postprocedure Evaluation (Signed)
Anesthesia Post Note  Patient: Monserath R Betsch  Procedure(s) Performed: COLONOSCOPY WITH PROPOFOL (N/A ) ESOPHAGOGASTRODUODENOSCOPY (EGD) WITH PROPOFOL (N/A )  Patient location during evaluation: PACU Anesthesia Type: General Level of consciousness: awake and alert Pain management: pain level controlled Vital Signs Assessment: post-procedure vital signs reviewed and stable Respiratory status: spontaneous breathing, nonlabored ventilation, respiratory function stable and patient connected to nasal cannula oxygen Cardiovascular status: blood pressure returned to baseline and stable Postop Assessment: no apparent nausea or vomiting Anesthetic complications: no   No complications documented.   Last Vitals:  Vitals:   01/02/20 1150 01/02/20 1200  BP: 113/79   Pulse: 81 83  Resp: 11   Temp:    SpO2:  100%    Last Pain:  Vitals:   01/02/20 1150  TempSrc:   PainSc: 0-No pain                 Yevette Edwards

## 2020-01-05 ENCOUNTER — Encounter: Payer: Self-pay | Admitting: Gastroenterology

## 2020-01-05 LAB — SURGICAL PATHOLOGY

## 2020-01-06 DIAGNOSIS — Z3009 Encounter for other general counseling and advice on contraception: Secondary | ICD-10-CM | POA: Diagnosis not present

## 2020-01-08 DIAGNOSIS — T85618S Breakdown (mechanical) of other specified internal prosthetic devices, implants and grafts, sequela: Secondary | ICD-10-CM | POA: Diagnosis not present

## 2020-01-08 DIAGNOSIS — G919 Hydrocephalus, unspecified: Secondary | ICD-10-CM | POA: Diagnosis not present

## 2020-01-12 ENCOUNTER — Other Ambulatory Visit: Payer: Self-pay

## 2020-01-12 ENCOUNTER — Encounter: Payer: Self-pay | Admitting: Emergency Medicine

## 2020-01-12 ENCOUNTER — Emergency Department
Admission: EM | Admit: 2020-01-12 | Discharge: 2020-01-12 | Disposition: A | Discharge status: Home / Self Care | Payer: 59 | Attending: Emergency Medicine | Admitting: Emergency Medicine

## 2020-01-12 ENCOUNTER — Emergency Department: Payer: 59

## 2020-01-12 ENCOUNTER — Telehealth: Payer: Self-pay | Admitting: Internal Medicine

## 2020-01-12 DIAGNOSIS — R079 Chest pain, unspecified: Secondary | ICD-10-CM | POA: Insufficient documentation

## 2020-01-12 DIAGNOSIS — Z5321 Procedure and treatment not carried out due to patient leaving prior to being seen by health care provider: Secondary | ICD-10-CM | POA: Insufficient documentation

## 2020-01-12 LAB — CBC
HCT: 38.6 % (ref 36.0–46.0)
Hemoglobin: 12.8 g/dL (ref 12.0–15.0)
MCH: 29.4 pg (ref 26.0–34.0)
MCHC: 33.2 g/dL (ref 30.0–36.0)
MCV: 88.5 fL (ref 80.0–100.0)
Platelets: 379 10*3/uL (ref 150–400)
RBC: 4.36 MIL/uL (ref 3.87–5.11)
RDW: 11.9 % (ref 11.5–15.5)
WBC: 10.1 10*3/uL (ref 4.0–10.5)
nRBC: 0 % (ref 0.0–0.2)

## 2020-01-12 LAB — BASIC METABOLIC PANEL
Anion gap: 8 (ref 5–15)
BUN: 12 mg/dL (ref 6–20)
CO2: 25 mmol/L (ref 22–32)
Calcium: 9 mg/dL (ref 8.9–10.3)
Chloride: 106 mmol/L (ref 98–111)
Creatinine, Ser: 0.71 mg/dL (ref 0.44–1.00)
GFR calc Af Amer: >60 mL/min (ref >60)
GFR calc non Af Amer: >60 mL/min (ref >60)
Glucose, Bld: 91 mg/dL (ref 70–99)
Potassium: 3.4 mmol/L — ABNORMAL LOW (ref 3.5–5.1)
Sodium: 139 mmol/L (ref 135–145)

## 2020-01-12 LAB — TROPONIN I (HIGH SENSITIVITY): Troponin I (High Sensitivity): <2 ng/L (ref <18)

## 2020-01-12 NOTE — ED Notes (Signed)
No answer when called from lobby x2. 

## 2020-01-12 NOTE — Telephone Encounter (Signed)
Pt c/o tightness in middle of their chest and states it feels like someone is standing on her chest. [name] states that the chest pain started on 01/11/20 at 2pm. Patient is speaking fine and states she has no SOB or difficulty speaking. Advised to go to the ER. Dawn Stark states she is planning to go to the ER at 12:30 and be accompanied by her parents.

## 2020-01-12 NOTE — ED Notes (Signed)
No answer when called from lobby x3. 

## 2020-01-12 NOTE — Telephone Encounter (Signed)
[facility] Nurse  Pt mom called wanting to get some advise on what she needed to do for her daughter she is having chest pain mom thinks that its just do to nerves because she will be having brain surgery in a few weeks

## 2020-01-12 NOTE — ED Notes (Signed)
No answer when called from lobby x1 

## 2020-01-12 NOTE — ED Triage Notes (Signed)
Patient to ER for c/o chest pain that began on Saturday at approx 1400. Patient has h/o HTN.

## 2020-01-14 NOTE — Telephone Encounter (Signed)
Patient seen in ED on 01/12/20 

## 2020-01-16 ENCOUNTER — Other Ambulatory Visit: Payer: Self-pay | Admitting: Neurosurgery

## 2020-01-16 DIAGNOSIS — G919 Hydrocephalus, unspecified: Secondary | ICD-10-CM

## 2020-01-21 DIAGNOSIS — R519 Headache, unspecified: Secondary | ICD-10-CM | POA: Diagnosis not present

## 2020-01-22 ENCOUNTER — Other Ambulatory Visit: Payer: Self-pay

## 2020-01-22 ENCOUNTER — Ambulatory Visit
Admission: RE | Admit: 2020-01-22 | Discharge: 2020-01-22 | Disposition: A | Discharge status: Home / Self Care | Payer: 59 | Source: Ambulatory Visit | Attending: Neurosurgery | Admitting: Neurosurgery

## 2020-01-22 DIAGNOSIS — G919 Hydrocephalus, unspecified: Secondary | ICD-10-CM | POA: Insufficient documentation

## 2020-01-22 LAB — CBC WITH DIFFERENTIAL/PLATELET
Abs Immature Granulocytes: 0.02 10*3/uL (ref 0.00–0.07)
Basophils Absolute: 0 10*3/uL (ref 0.0–0.1)
Basophils Relative: 0 %
Eosinophils Absolute: 0.2 10*3/uL (ref 0.0–0.5)
Eosinophils Relative: 2 %
HCT: 37.8 % (ref 36.0–46.0)
Hemoglobin: 12.6 g/dL (ref 12.0–15.0)
Immature Granulocytes: 0 %
Lymphocytes Relative: 34 %
Lymphs Abs: 3.5 10*3/uL (ref 0.7–4.0)
MCH: 29.6 pg (ref 26.0–34.0)
MCHC: 33.3 g/dL (ref 30.0–36.0)
MCV: 88.7 fL (ref 80.0–100.0)
Monocytes Absolute: 0.6 10*3/uL (ref 0.1–1.0)
Monocytes Relative: 6 %
Neutro Abs: 5.9 10*3/uL (ref 1.7–7.7)
Neutrophils Relative %: 58 %
Platelets: 342 10*3/uL (ref 150–400)
RBC: 4.26 MIL/uL (ref 3.87–5.11)
RDW: 12 % (ref 11.5–15.5)
WBC: 10.2 10*3/uL (ref 4.0–10.5)
nRBC: 0 % (ref 0.0–0.2)

## 2020-01-22 LAB — PROTIME-INR
INR: 1 (ref 0.8–1.2)
Prothrombin Time: 12.3 seconds (ref 11.4–15.2)

## 2020-01-22 LAB — APTT: aPTT: 29 seconds (ref 24–36)

## 2020-01-22 LAB — POCT PREGNANCY, URINE: Preg Test, Ur: NEGATIVE

## 2020-01-22 NOTE — OR Nursing (Signed)
Patient resting with eyes closed. Arouses easily to name called. Follows all commands. Denies pain at this time.

## 2020-01-29 DIAGNOSIS — Z30017 Encounter for initial prescription of implantable subdermal contraceptive: Secondary | ICD-10-CM | POA: Diagnosis not present

## 2020-02-04 ENCOUNTER — Telehealth: Payer: Self-pay | Admitting: Internal Medicine

## 2020-02-04 NOTE — Telephone Encounter (Signed)
Inform mom call when ready for [facility] had appt but did not go

## 2020-02-04 NOTE — Telephone Encounter (Signed)
Rejection Reason - Patient was No Show" [facility] said about 23 hours ago

## 2020-02-13 DIAGNOSIS — G919 Hydrocephalus, unspecified: Secondary | ICD-10-CM | POA: Diagnosis not present

## 2020-02-13 DIAGNOSIS — Z982 Presence of cerebrospinal fluid drainage device: Secondary | ICD-10-CM | POA: Diagnosis not present

## 2020-02-18 DIAGNOSIS — Z03818 Encounter for observation for suspected exposure to other biological agents ruled out: Secondary | ICD-10-CM | POA: Diagnosis not present

## 2020-02-18 DIAGNOSIS — Z01818 Encounter for other preprocedural examination: Secondary | ICD-10-CM | POA: Diagnosis not present

## 2020-02-20 DIAGNOSIS — G919 Hydrocephalus, unspecified: Secondary | ICD-10-CM | POA: Diagnosis not present

## 2020-02-20 DIAGNOSIS — G914 Hydrocephalus in diseases classified elsewhere: Secondary | ICD-10-CM | POA: Diagnosis not present

## 2020-02-20 DIAGNOSIS — M545 Low back pain: Secondary | ICD-10-CM | POA: Diagnosis not present

## 2020-02-20 DIAGNOSIS — G43009 Migraine without aura, not intractable, without status migrainosus: Secondary | ICD-10-CM | POA: Diagnosis not present

## 2020-02-20 DIAGNOSIS — H4720 Unspecified optic atrophy: Secondary | ICD-10-CM | POA: Diagnosis not present

## 2020-02-20 DIAGNOSIS — K219 Gastro-esophageal reflux disease without esophagitis: Secondary | ICD-10-CM | POA: Diagnosis not present

## 2020-02-20 DIAGNOSIS — G4730 Sleep apnea, unspecified: Secondary | ICD-10-CM | POA: Diagnosis not present

## 2020-02-20 DIAGNOSIS — Q150 Congenital glaucoma: Secondary | ICD-10-CM | POA: Diagnosis not present

## 2020-02-20 DIAGNOSIS — I10 Essential (primary) hypertension: Secondary | ICD-10-CM | POA: Diagnosis not present

## 2020-02-20 DIAGNOSIS — T85618A Breakdown (mechanical) of other specified internal prosthetic devices, implants and grafts, initial encounter: Secondary | ICD-10-CM | POA: Diagnosis not present

## 2020-02-20 DIAGNOSIS — T8501XA Breakdown (mechanical) of ventricular intracranial (communicating) shunt, initial encounter: Secondary | ICD-10-CM | POA: Diagnosis not present

## 2020-02-22 DIAGNOSIS — Z982 Presence of cerebrospinal fluid drainage device: Secondary | ICD-10-CM | POA: Diagnosis not present

## 2020-02-22 DIAGNOSIS — R600 Localized edema: Secondary | ICD-10-CM | POA: Diagnosis not present

## 2020-02-22 DIAGNOSIS — G911 Obstructive hydrocephalus: Secondary | ICD-10-CM | POA: Diagnosis not present

## 2020-02-22 DIAGNOSIS — R220 Localized swelling, mass and lump, head: Secondary | ICD-10-CM | POA: Diagnosis not present

## 2020-02-22 DIAGNOSIS — D72829 Elevated white blood cell count, unspecified: Secondary | ICD-10-CM | POA: Diagnosis not present

## 2020-02-24 ENCOUNTER — Other Ambulatory Visit: Payer: Self-pay | Admitting: *Deleted

## 2020-02-24 ENCOUNTER — Encounter: Payer: Self-pay | Admitting: *Deleted

## 2020-02-24 NOTE — Patient Outreach (Signed)
[facility]) Care Management  02/24/2020  LIDIE GLADE 2000/07/30 [id number]   Transition of care call   Referral received: 02/20/20 Initial outreach:02/24/20 Insurance: UMR    Subjective: Initial successful telephone call to patient's mother Scarlette Calico, Designated Party Release,  preferred number in order to complete transition of care assessment; 2 HIPAA identifiers verified. Explained purpose of call and completed transition of care assessment.  Spoke with Scarlette Calico, she reports that Titania is doing fairly well . She discussed recent hospital admission and return to ED. She reports patient is bothered by fluid backing up instead of draining as it should since surgery. She report patient has swelling to facial area and eyes swollen shut. She reports patient is on steroids to help with  condition She has follow up appointment with surgeon on 8/4. She reports patient pain is controlled with prn tylenol and ibuprofen. She reports surgical incision area with dressing in place.  She reports patient has some difficulty with chewing due to swelling and is mostly taking in liquids, soups. Reinforced importance of protein intake to help with healing , discussed liquid protein sources and she has tolerated a liquid protein drink she had at home.   Patient mother and siblings are assisting with her recovery, patient has a Administrator, Civil Service.  She denies any ongoing health issues and says she does not need a referral to one of the [place] chronic disease management programs.  Patient mother  states that she has  the hospital indemnity, provided contact number to [employer] to file a claim [phone]. Patient mother states that she is currently on FMLA until September.  She uses a [facility] outpatient pharmacy at [facility] outpatient pharmacy.     Objective:  Kinsey Karch  was hospitalized at [facility] system from 7/30-7/31/21 for Revision of ventriculo peritoneal shunt   Comorbidities include: Hydrocephalus, Blindness, migraine headaches.  She was discharged to home on 02/21/20 without the need for home health services or DME. 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    Assessment:  Patient voices good understanding of all discharge instructions.  See transition of care flowsheet for assessment details.   Plan:  Reviewed hospital discharge diagnosis of Revision of ventriculo peritoneal shunt   and discharge treatment plan using hospital discharge instructions, assessing medication adherence, reviewing problems requiring provider notification, and discussing the importance of follow up with surgeon, primary care provider and/or specialists as directed.  Reviewed [place] healthy lifestyle program information to receive discounted premium for  2022   Step 1: Get  your annual physical  Step 2: Complete your health assessment  Step 3:Identify your current health status and complete the corresponding action step between January 1, and March 24, 2020.    Patient mother agreeable to follow up transition of care of call  in next week.   Routed successful outreach letter with [facility] Care Management pamphlet and 24 Hour Nurse Line Magnet to [employer] Care Management clinical pool to be mailed to patient's home address.   Egbert Garibaldi, RN, BSN  [facility] Care Management,Care Management Coordinator  [phone]- Mobile [phone]- Toll Free Main Office

## 2020-03-02 ENCOUNTER — Other Ambulatory Visit: Payer: Self-pay | Admitting: *Deleted

## 2020-03-02 NOTE — Patient Outreach (Signed)
[facility]) Care Management  03/02/2020  DENEISHA DADE 2000-08-08 [id number]   Transition of care call/case closure   Referral received: 02/20/20 Initial outreach:02/24/20 Insurance: UMR    Subjective: Successful outreach call to patient mother , Scarlette Calico, she states patient is doing much better, decreased swelling in face, able to open eyes well. She reports patient appetite improved, she denies headache or discomfort.  Patient has follow up visit on 8/11 with provider.  Patient mother denies any new concerns.   Objective: [facility] Revision of ventriculo peritoneal shunt  Comorbidities include: Hydrocephalus, Blindness, migraine headaches.  She was discharged to home on 02/21/20 without the need for home health servicesor DME. Patient return to ED at [facility] on 8/1  for obstructive hydrocephalus, swelling in facial area.   Plan Will plan case closure on new care management needs identified.    Egbert Garibaldi, RN, BSN  [facility] Care Management,Care Management Coordinator  [phone]- Mobile [phone]- Toll Free Main Office

## 2020-03-25 ENCOUNTER — Other Ambulatory Visit: Payer: Self-pay | Admitting: Gastroenterology

## 2020-03-25 DIAGNOSIS — K581 Irritable bowel syndrome with constipation: Secondary | ICD-10-CM

## 2020-03-25 DIAGNOSIS — K5909 Other constipation: Secondary | ICD-10-CM

## 2020-03-25 NOTE — Telephone Encounter (Signed)
Last office visit 12/16/2019 Constipation  Last refill 12/16/2019 2 refills

## 2020-04-12 ENCOUNTER — Other Ambulatory Visit: Payer: Self-pay

## 2020-04-12 ENCOUNTER — Ambulatory Visit (INDEPENDENT_AMBULATORY_CARE_PROVIDER_SITE_OTHER): Payer: 59

## 2020-04-12 DIAGNOSIS — Z23 Encounter for immunization: Secondary | ICD-10-CM | POA: Diagnosis not present

## 2020-04-13 ENCOUNTER — Other Ambulatory Visit: Payer: Self-pay | Admitting: Gastroenterology

## 2020-04-13 ENCOUNTER — Encounter: Payer: Self-pay | Admitting: Gastroenterology

## 2020-04-13 ENCOUNTER — Ambulatory Visit (INDEPENDENT_AMBULATORY_CARE_PROVIDER_SITE_OTHER): Payer: 59 | Admitting: Gastroenterology

## 2020-04-13 VITALS — BP 121/86 | HR 114 | Temp 98.5°F | Ht <= 58 in | Wt 124.1 lb

## 2020-04-13 DIAGNOSIS — K5909 Other constipation: Secondary | ICD-10-CM | POA: Diagnosis not present

## 2020-04-13 DIAGNOSIS — K581 Irritable bowel syndrome with constipation: Secondary | ICD-10-CM | POA: Diagnosis not present

## 2020-04-13 MED ORDER — LINACLOTIDE 290 MCG PO CAPS: 290.0000 ug | ORAL_CAPSULE | Freq: Every day | ORAL | 3 refills | Status: DC

## 2020-04-13 MED ORDER — GOLYTELY 236 G PO SOLR
4000.0000 mL | Freq: Once | ORAL | 0 refills | Status: AC
Start: 2020-04-13 — End: 2020-04-13

## 2020-04-13 NOTE — Progress Notes (Signed)
Dawn Repress, MD [address]  [address]  [place], [place] [address]  Main: [phone]  Fax: [phone]    Gastroenterology Consultation  Referring Provider:     McLean-Scocuzza, French Ana * Primary Care Physician:  McLean-Scocuzza, Pasty Spillers, MD Primary Gastroenterologist:  Dr. Arlyss Stark Reason for Consultation:     Chronic constipation        HPI:   Dawn Stark is a 19 y.o. y/o female referred by Dr. Judie Grieve, Pasty Spillers, MD  for consultation & management of Chronic constipation. This has started since age of 65. She has history of hydrocephalus status post shunt placement. She is legally blind and currently [place] [employer]. She is accompanied by her mom today. She has tried MiraLAX for several years prescribed by her pediatrician which does not help. She is also prescribed Dulcolax. She has a BM every one to 2 weeks which are hard in consistency, spends lot of time on commode associated with straining and burning, blood on wiping, feels like the hemorrhoids prolapse when she pushes hard, spontaneously reduce only partially after defecation, also reports bloating and lower abdominal discomfort. Her TSH was normal in the past. She is on Pepcid for GERD  X-Delman abdomen in the past revealed significant stool burden.  Follow-up visit 05/28/2017: She tried linaclotide 290 MCG daily and it worked for the first 2 weeks, she was having one to 2 bowel movements daily without straining and they were soft and formed. But for the last 2 weeks she thinks the affect has worn off. She has been taking MiraLAX at bedtime. She thinks adding fiber such as fiber gummies she tried in the past were helping. She still continues to take linaclotide in the morning before breakfast. Her last bowel movement was Saturday and feels like she completely emptied. Now her symptoms of bloating, and rectal discomfort have returned.  Follow-up visit 12/18/2017 She is accompanied  by her father today. She reports that her PCP changed in appetite to 145 MCG which is not emptying her bowels completely. She reports that she was having 4-5 loose bowel movements associated with complete emptying on higher dose. She is asking if she could go back to 290 MCG daily. She otherwise denies any complaints today  Follow-up visit 12/16/2019 Patient is here with her parents for follow-up of constipation.  Patient ran out of Linzess about 2 months ago.  Her constipation is worse.  She is hardly having any bowel movements, last bowel movement was more than a week ago.  She also reports significant abdominal bloating associated with upper abdominal discomfort, nocturnal heartburn.  She is taking omeprazole 40 mg before dinner.  She denies any rectal bleeding. She reports that Linzess 290 MCG was helping with constipation  Follow-up visit 04/14/2019 Patient had an upper endoscopy and colonoscopy which were unremarkable. She is now suffering from severe constipation, her last BM was yesterday but incomplete.  She had to increase the dose on Linzess to 2 capsules daily.  Reports abdominal bloating and weight gain.  Her diet is devoid of fiber.  She did not have any GI surgeries No family history of colon cancer or other GI malignancy  GI Procedures:  EGD and colonoscopy 01/02/2020  - Normal duodenal bulb and second portion of the duodenum. - Erosive gastropathy with active bleeding. - Non-bleeding gastric ulcer with a clean ulcer base (Forrest Class III). Biopsied. Clip (MR conditional) was placed. - Esophagogastric landmarks identified. - Normal  gastroesophageal junction and esophagus.  - The entire examined colon is normal. - The examined portion of the ileum was normal. - The distal rectum and anal verge are normal on retroflexion view. - No specimens collected.  DIAGNOSIS:  A. STOMACH; COLD BIOPSY:  - ANTRAL MUCOSA WITH CHANGES CONSISTENT WITH HEALING MUCOSAL INJURY.  - UNREMARKABLE  OXYNTIC MUCOSA.  - NEGATIVE FOR H. PYLORI, DYSPLASIA, AND MALIGNANCY.   Past Medical History:  Diagnosis Date  . Blind    since age 31 y.o   . Central sleep apnea   . Constipation   . GERD (gastroesophageal reflux disease)   . Hydrocephalus (HCC)   . Hypertension   . Migraine   . Open-angle glaucoma   . Optic atrophy, both eyes   . Preterm delivery    25 weeks  . Seizures (HCC)     Past Surgical History:  Procedure Laterality Date  . BRAIN SURGERY     And shunt placement  . COLONOSCOPY WITH PROPOFOL N/A 01/02/2020   Procedure: COLONOSCOPY WITH PROPOFOL;  Surgeon: Toney Reil, MD;  Location: [facility] ENDOSCOPY;  Service: Gastroenterology;  Laterality: N/A;  . CRAINOTOMY Left 01/2012   for dural biopsy  . ESOPHAGOGASTRODUODENOSCOPY (EGD) WITH PROPOFOL N/A 01/02/2020   Procedure: ESOPHAGOGASTRODUODENOSCOPY (EGD) WITH PROPOFOL;  Surgeon: Toney Reil, MD;  Location: [facility] ENDOSCOPY;  Service: Gastroenterology;  Laterality: N/A;  . EXCISION OF SUBGLOTIC CYST  01/1999  . FRONTAL BOLT PLACEMENT Left 02/2010  . 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 VENTRICULOPERITONEAL SHUNT Right 10/1998  . VENTRICULOPERITONEAL SHUNT  12/2005, 03/2006, 09/2009   REVISIONS    Current Outpatient Medications:  .  AIMOVIG 70 MG/ML SOAJ, SMARTSIG:70 Milligram(s) SUB-Q Once a Month, Disp: , Rfl:  .  butalbital-acetaminophen-caffeine (FIORICET, ESGIC) 50-325-40 MG tablet, butalbital-acetaminophen-caffeine 50 mg-325 mg-40 mg tablet, Disp: , Rfl:  .  etonogestrel (NEXPLANON) 68 MG IMPL implant, 68 mg., Disp: , Rfl:  .  LINZESS 290 MCG CAPS capsule, TAKE 1 CAPSULE BY MOUTH DAILY. DO NOT CRUSH/CHEW, Disp: 90 capsule, Rfl: 0 .  metoprolol succinate (TOPROL-XL) 50 MG 24 hr tablet, Take 1 tablet (50 mg total) by mouth at bedtime., Disp: 90 tablet, Rfl: 1 .  omeprazole (PRILOSEC) 40 MG capsule, Take 1 capsule (40 mg total) by mouth 2 (two) times daily before a meal., Disp: 60 capsule, Rfl: 2 .   acetaminophen (TYLENOL) 325 MG tablet, Take by mouth. (Patient not taking: Reported on 04/13/2020), Disp: , Rfl:  .  Fremanezumab-vfrm (AJOVY) 225 MG/1.5ML SOAJ, Inject into the skin. (Patient not taking: Reported on 04/13/2020), Disp: , Rfl:     Family History  Problem Relation Age of Onset  . Heart attack Father   . Heart disease Father      Social History   Tobacco Use  . Smoking status: Never Smoker  . Smokeless tobacco: Never Used  Vaping Use  . Vaping Use: Never used  Substance Use Topics  . Alcohol use: No  . Drug use: No    Allergies as of 04/13/2020 - Review Complete 04/13/2020  Allergen Reaction Noted  . Ativan [lorazepam] Other (See Comments) 05/06/2016    Review of Systems:    All systems reviewed and negative except where noted in HPI.   Physical Exam:  BP 121/86 (BP Location: Left Arm, Patient Position: Sitting, Cuff Size: Normal)   Pulse (!) 114   Temp 98.5 F (36.9 C) (Oral)   Ht 4\' 9"  (1.448 m)   Wt 124 lb 2 oz (56.3  kg)   BMI 26.86 kg/m  No LMP recorded. Patient has had an implant.  General:   Alert,  Well-developed, well-nourished, pleasant and cooperative in NAD Head:  Normocephalic and atraumatic. Eyes:  Sclera clear, no icterus.   Conjunctiva pink. Ears:  Normal auditory acuity. Nose:  No deformity, discharge, or lesions. Mouth:  No deformity or lesions,oropharynx pink & moist. Neck:  Supple; no masses or thyromegaly. Lungs:  Respirations even and unlabored.  Clear throughout to auscultation.   No wheezes, crackles, or rhonchi. No acute distress. Heart:  Regular rate and rhythm; no murmurs, clicks, rubs, or gallops. Abdomen:  Normal bowel sounds.  No bruits.  Soft, moderately distended, nontender, without masses, hepatosplenomegaly or hernias noted.  No guarding or rebound tenderness.   Rectal: Nor performed Msk:  Symmetrical without gross deformities. Good, equal movement & strength bilaterally. Pulses:  Normal pulses noted. Extremities:  No  clubbing or edema.  No cyanosis. Neurologic:  Alert and oriented x3;  grossly normal neurologically. Skin:  Intact without significant lesions or rashes. No jaundice. Psych:  Alert and cooperative. Normal mood and affect.  Imaging Studies: No recent abdominal imaging other than plain x-Nin abdomen  Assessment and Plan:   NAKENYA THEALL is a 19 y.o. 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 Most likely idiopathic or slow transit secondary to underlying neurologic problem. TSH was normal in the past.   Chronic constipation Continue linaclotide 290 MCG before breakfast GoLYTELY for bowel cleanout  Follow-up once a year or as needed   Dawn Repress, MD

## 2020-04-13 NOTE — Addendum Note (Signed)
Addended by: Radene Knee L on: 04/13/2020 03:13 PM   Modules accepted: Orders

## 2020-04-15 ENCOUNTER — Telehealth: Payer: Self-pay | Admitting: Internal Medicine

## 2020-04-15 ENCOUNTER — Other Ambulatory Visit: Payer: Self-pay

## 2020-04-15 ENCOUNTER — Other Ambulatory Visit: Payer: Self-pay | Admitting: Internal Medicine

## 2020-04-15 ENCOUNTER — Ambulatory Visit
Admission: EM | Admit: 2020-04-15 | Discharge: 2020-04-15 | Disposition: A | Discharge status: Home / Self Care | Payer: 59 | Attending: Family Medicine | Admitting: Family Medicine

## 2020-04-15 DIAGNOSIS — R9431 Abnormal electrocardiogram [ECG] [EKG]: Secondary | ICD-10-CM | POA: Diagnosis not present

## 2020-04-15 DIAGNOSIS — R002 Palpitations: Secondary | ICD-10-CM | POA: Diagnosis not present

## 2020-04-15 DIAGNOSIS — R5383 Other fatigue: Secondary | ICD-10-CM

## 2020-04-15 DIAGNOSIS — R000 Tachycardia, unspecified: Secondary | ICD-10-CM

## 2020-04-15 DIAGNOSIS — R0602 Shortness of breath: Secondary | ICD-10-CM

## 2020-04-15 DIAGNOSIS — G911 Obstructive hydrocephalus: Secondary | ICD-10-CM | POA: Diagnosis not present

## 2020-04-15 DIAGNOSIS — I10 Essential (primary) hypertension: Secondary | ICD-10-CM | POA: Diagnosis not present

## 2020-04-15 DIAGNOSIS — D72828 Other elevated white blood cell count: Secondary | ICD-10-CM | POA: Diagnosis not present

## 2020-04-15 DIAGNOSIS — H548 Legal blindness, as defined in USA: Secondary | ICD-10-CM | POA: Diagnosis not present

## 2020-04-15 DIAGNOSIS — D72829 Elevated white blood cell count, unspecified: Secondary | ICD-10-CM | POA: Diagnosis not present

## 2020-04-15 DIAGNOSIS — R0789 Other chest pain: Secondary | ICD-10-CM | POA: Diagnosis not present

## 2020-04-15 DIAGNOSIS — R079 Chest pain, unspecified: Secondary | ICD-10-CM | POA: Diagnosis not present

## 2020-04-15 DIAGNOSIS — Z20822 Contact with and (suspected) exposure to covid-19: Secondary | ICD-10-CM | POA: Diagnosis not present

## 2020-04-15 MED ORDER — METOPROLOL SUCCINATE ER 50 MG PO TB24: 50.0000 mg | ORAL_TABLET | Freq: Every day | ORAL | 1 refills | Status: DC

## 2020-04-15 NOTE — Telephone Encounter (Signed)
Patient has gone to be seen in urgent care today

## 2020-04-15 NOTE — Telephone Encounter (Signed)
Patient's mother was advised that the doctor that was filling her metoprolol succinate (TOPROL-XL) 50 MG 24 hr tablet, will not refill. She was told that had to be refilled by her primary provider. Heart rate at 7pm, is running 121, bp 121/99. Patient has not had her medication since last Tuesday. She needs to have this medication.

## 2020-04-15 NOTE — Telephone Encounter (Signed)
Left message to return call 

## 2020-04-15 NOTE — Telephone Encounter (Signed)
Spoke with the patient and informed of the below. Patient verbalized understanding

## 2020-04-15 NOTE — Telephone Encounter (Signed)
Pt mother called stating that patient heart rate is now In the 170's

## 2020-04-15 NOTE — Telephone Encounter (Signed)
Mom needs to call Dr. Mariah Milling cards for appt as well

## 2020-04-15 NOTE — ED Provider Notes (Signed)
[facility]   [id number] 04/15/20 Arrival Time: 1156   CC: CHEST PAIN  SUBJECTIVE:  Dawn Stark is a 19 y.o. female who presents with complaint of abrupt or gradual chest pain that began yesterday. Reports that she has been experiencing increased heart rate for the last 24 hours. Denies a precipitating event, trauma, recent lower respiratory tract, or strenuous upper body activities. Also reports palpitations and fatigue. Localizes chest pain to the substernal region. Reports that she was unable to sleep well last night due to chest pain. Reports that it is intermittent and lasts a few minutes at a time. She reports that she feels out of breath when the spidoe resolves. Takes metoprolol, has hx of intracranial shunt, had craniotomy x 3 weeks ago. Reports that they called her PCP and were told to be seen at Urgent Care. Denies radiates symptoms. Denies previous symptoms in the past.  Denies fever, chills, lightheadedness, dizziness,  SOB, nausea, vomiting, abdominal pain, changes in bowel or bladder habits, diaphoresis, numbness/tingling in extremities, peripheral edema, or anxiety.    ROS: As per HPI.  All other pertinent ROS negative.    Past Medical History:  Diagnosis Date  . Blind    since age 65 y.o   . Central sleep apnea   . Constipation   . GERD (gastroesophageal reflux disease)   . Hydrocephalus (HCC)   . Hypertension   . Migraine   . Open-angle glaucoma   . Optic atrophy, both eyes   . Preterm delivery    25 weeks  . Seizures (HCC)    Past Surgical History:  Procedure Laterality Date  . BRAIN SURGERY     And shunt placement  . COLONOSCOPY WITH PROPOFOL N/A 01/02/2020   Procedure: COLONOSCOPY WITH PROPOFOL;  Surgeon: Toney Reil, MD;  Location: [facility] ENDOSCOPY;  Service: Gastroenterology;  Laterality: N/A;  . CRAINOTOMY Left 01/2012   for dural biopsy  . ESOPHAGOGASTRODUODENOSCOPY (EGD) WITH PROPOFOL N/A 01/02/2020   Procedure:  ESOPHAGOGASTRODUODENOSCOPY (EGD) WITH PROPOFOL;  Surgeon: Toney Reil, MD;  Location: [facility] ENDOSCOPY;  Service: Gastroenterology;  Laterality: N/A;  . EXCISION OF SUBGLOTIC CYST  01/1999  . FRONTAL BOLT PLACEMENT Left 02/2010  . 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 VENTRICULOPERITONEAL SHUNT Right 10/1998  . VENTRICULOPERITONEAL SHUNT  12/2005, 03/2006, 09/2009   REVISIONS   Allergies  Allergen Reactions  . Ativan [Lorazepam] Other (See Comments)    combative   No current facility-administered medications on file prior to encounter.   Current Outpatient Medications on File Prior to Encounter  Medication Sig Dispense Refill  . AIMOVIG 70 MG/ML SOAJ SMARTSIG:70 Milligram(s) SUB-Q Once a Month    . etonogestrel (NEXPLANON) 68 MG IMPL implant 68 mg.    . linaclotide (LINZESS) 290 MCG CAPS capsule Take 1 capsule (290 mcg total) by mouth daily before breakfast. 90 capsule 3  . metoprolol succinate (TOPROL-XL) 50 MG 24 hr tablet Take 1 tablet (50 mg total) by mouth at bedtime. 90 tablet 1  . acetaminophen (TYLENOL) 325 MG tablet Take by mouth. (Patient not taking: Reported on 04/13/2020)    . butalbital-acetaminophen-caffeine (FIORICET, ESGIC) 50-325-40 MG tablet butalbital-acetaminophen-caffeine 50 mg-325 mg-40 mg tablet    . Fremanezumab-vfrm (AJOVY) 225 MG/1.5ML SOAJ Inject into the skin. (Patient not taking: Reported on 04/13/2020)    . LINZESS 290 MCG CAPS capsule TAKE 1 CAPSULE BY MOUTH DAILY. DO NOT CRUSH/CHEW 90 capsule 0  . omeprazole (PRILOSEC) 40 MG capsule Take 1 capsule (40 mg total) by  mouth 2 (two) times daily before a meal. 60 capsule 2   Social History   Socioeconomic History  . Marital status: Single    Spouse name: Not on file  . Number of children: Not on file  . Years of education: Not on file  . Highest education level: Not on file  Occupational History  . Not on file  Tobacco Use  . Smoking status: Never Smoker  . Smokeless tobacco: Never Used  Vaping  Use  . Vaping Use: Never used  Substance and Sexual Activity  . Alcohol use: No  . Drug use: No  . Sexual activity: Never  Other Topics Concern  . Not on file  Social History Narrative   [employer].    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 Difficulty of Paying Living Expenses: Not on file  Food Insecurity:   . Worried About Programme researcher, broadcasting/film/video in the Last Year: Not on file  . Ran Out of Food in the Last Year: Not on file  Transportation Needs:   . Lack of Transportation (Medical): Not on file  . Lack of Transportation (Non-Medical): Not on file  Physical Activity:   . Days of Exercise per Week: Not on file  . Minutes of Exercise per Session: Not on file  Stress:   . Feeling of Stress : Not on file  Social Connections:   . Frequency of Communication with Friends and Family: Not on file  . Frequency of Social Gatherings with Friends and Family: Not on file  . Attends Religious Services: Not on file  . Active Member of Clubs or Organizations: Not on file  . Attends Banker Meetings: Not on file  . Marital Status: Not on file  Intimate Partner Violence:   . Fear of Current or Ex-Partner: Not on file  . Emotionally Abused: Not on file  . Physically Abused: Not on file  . Sexually Abused: Not on file   Family History  Problem Relation Age of Onset  . Heart attack Father   . Heart disease Father      OBJECTIVE:  Vitals:   04/15/20 1209  BP: 117/76  Pulse: (!) 113  Resp: 14  Temp: 99.2 F (37.3 C)  SpO2: 97%    General appearance: alert; no distress Eyes: PERRLA; EOMI; conjunctiva normal HENT: normocephalic; atraumatic Neck: supple Lungs: clear to auscultation bilaterally without adventitious breath sounds Heart: regular rate and rhythm.  Clear S1 and S2 without rubs, gallops, or murmur. Chest Wall: non tender, no thrills Abdomen: soft,  non-tender; bowel sounds normal; no masses or organomegaly; no guarding or rebound tenderness Extremities: no cyanosis or edema; symmetrical with no gross deformities Skin: warm and dry Psychological: alert and cooperative; normal mood and affect  ECG: Orders placed or performed during the hospital encounter of 04/15/20  . ED EKG  . ED EKG    EKG sinus tach without ST depressions, or prolonged PR interval.  No narrowing or widening of the QRS complexes.   LABS:  Results for orders placed or performed during the hospital encounter of 01/22/20  Pregnancy, urine POC  Result Value Ref Range   Preg Test, Ur NEGATIVE NEGATIVE   Labs Reviewed - No data to display  DIAGNOSTIC STUDIES:  No results found.   ASSESSMENT & PLAN:  1. Tachycardia   2.  Other fatigue   3. Palpitations   4. SOB (shortness of breath)    Presents with fatigue, palpitations, SOB and tachycardia since last night Discussed with patient and her mother that given her complicated medical history and symptoms she would be best served in the ER for further evaluation and treatment Patient and mother agree Will proceed to ER via personal vehicle Stable at discharge    Moshe Cipro, NP 04/15/20 1315

## 2020-04-15 NOTE — Telephone Encounter (Signed)
Patient is currentlyn 142/100 and bprm 170. Patients mother gave her 81mg  Bare, patient has been feeling palpitations. No other sx. Rx has been refilled. Instructed mother due to her sx she is needing to be evaluated. Due to no available appointments today, she was instructed to take patient to UC which she stated they would comply.

## 2020-04-15 NOTE — ED Triage Notes (Signed)
Patient c/o active chest pain for "a few days".

## 2020-04-15 NOTE — Discharge Instructions (Signed)
Go to the ER for further evaluation and treatment. 

## 2020-04-18 ENCOUNTER — Encounter: Payer: Self-pay | Admitting: Internal Medicine

## 2020-04-29 ENCOUNTER — Other Ambulatory Visit: Payer: Self-pay

## 2020-04-29 ENCOUNTER — Ambulatory Visit (INDEPENDENT_AMBULATORY_CARE_PROVIDER_SITE_OTHER): Payer: 59 | Admitting: Physician Assistant

## 2020-04-29 ENCOUNTER — Encounter: Payer: Self-pay | Admitting: Physician Assistant

## 2020-04-29 VITALS — BP 104/70 | HR 93 | Ht <= 58 in | Wt 123.0 lb

## 2020-04-29 DIAGNOSIS — K219 Gastro-esophageal reflux disease without esophagitis: Secondary | ICD-10-CM | POA: Diagnosis not present

## 2020-04-29 DIAGNOSIS — R0789 Other chest pain: Secondary | ICD-10-CM | POA: Diagnosis not present

## 2020-04-29 DIAGNOSIS — Z982 Presence of cerebrospinal fluid drainage device: Secondary | ICD-10-CM

## 2020-04-29 DIAGNOSIS — D72829 Elevated white blood cell count, unspecified: Secondary | ICD-10-CM | POA: Diagnosis not present

## 2020-04-29 DIAGNOSIS — G4731 Primary central sleep apnea: Secondary | ICD-10-CM

## 2020-04-29 DIAGNOSIS — Z87898 Personal history of other specified conditions: Secondary | ICD-10-CM | POA: Diagnosis not present

## 2020-04-29 DIAGNOSIS — R000 Tachycardia, unspecified: Secondary | ICD-10-CM | POA: Diagnosis not present

## 2020-04-29 DIAGNOSIS — R002 Palpitations: Secondary | ICD-10-CM | POA: Diagnosis not present

## 2020-04-29 DIAGNOSIS — H543 Unqualified visual loss, both eyes: Secondary | ICD-10-CM | POA: Diagnosis not present

## 2020-04-29 NOTE — Patient Instructions (Addendum)
Medication Instructions:  No changes  *If you need a refill on your cardiac medications before your next appointment, please call your pharmacy*   Lab Work: None  If you have labs (blood work) drawn today and your tests are completely normal, you will receive your results only by: Marland Kitchen MyChart Message (if you have MyChart) OR . A paper copy in the mail If you have any lab test that is abnormal or we need to change your treatment, we will call you to review the results.   Testing/Procedures: Your physician has requested that you have an echocardiogram. Echocardiography is a painless test that uses sound waves to create images of your heart. It provides your doctor with information about the size and shape of your heart and how well your heart's chambers and valves are working. This procedure takes approximately one hour. There are no restrictions for this procedure.  Monitor will be placed after your echocardiogram has been done. We will have it mailed to your home.  Your physician has recommended that you wear a Zio monitor. This monitor is a medical device that records the heart's electrical activity. Doctors most often use these monitors to diagnose arrhythmias. Arrhythmias are problems with the speed or rhythm of the heartbeat. The monitor is a small device applied to your chest. You can wear one while you do your normal daily activities. While wearing this monitor if you have any symptoms to push the button and record what you felt. Once you have worn this monitor for the period of time provider prescribed (Usually 14 days), you will return the monitor device in the postage paid box. Once it is returned they will download the data collected and provide [place] with a report which the provider will then review and we will call you with those results. Important tips:  1. Avoid showering during the first 24 hours of wearing the monitor. 2. Avoid excessive sweating to help maximize wear time. 3. Do  not submerge the device, no hot tubs, and no swimming pools. 4. Keep any lotions or oils away from the patch. 5. After 24 hours you may shower with the patch on. Take brief showers with your back facing the shower head.  6. Do not remove patch once it has been placed because that will interrupt data and decrease adhesive wear time. 7. Push the button when you have any symptoms and write down what you were feeling. 8. Once you have completed wearing your monitor, remove and place into box which has postage paid and place in your outgoing mailbox.  9. If for some reason you have misplaced your box then call our office and we can provide another box and/or mail it off for you.         Follow-Up: At [facility], you and your health needs are our priority.  As part of our continuing mission to provide you with exceptional heart care, we have created designated Provider Care Teams.  These Care Teams include your primary Cardiologist (physician) and Advanced Practice Providers (APPs -  Physician Assistants and Nurse Practitioners) who all work together to provide you with the care you need, when you need it.   Your next appointment:   2 month(s)  The format for your next appointment:   In Person  Provider:   You may see Dr.Timothy Gollan or one of our Advanced Practice or one of the following Providers on your designated Care Team:    Nicolasa Ducking, NP  Eula Listen,  PA-C  Marisue Ivan, PA-C  Cadence Fransico Michael, [place]

## 2020-04-29 NOTE — Progress Notes (Signed)
Office Visit    Patient Name: Dawn Stark Date of Encounter: 04/29/2020  Primary Care Provider:  McLean-Scocuzza, Pasty Spillers, MD Primary Cardiologist:  Julien Nordmann, MD  Chief Complaint    Chief Complaint  Patient presents with  . Other    ED follow up - Chest pain and Palpitations. Heart rate has been all over the place at rest. meds reviewed verbally with patient.     19 year old female with history of syncope, chronic constipation, blindness, headaches, previously seen 03/07/2018, and here today for ED follow-up of chest pain and palpitations.  Past Medical History    Past Medical History:  Diagnosis Date  . Blind    since age 39 y.o   . Central sleep apnea   . Constipation   . GERD (gastroesophageal reflux disease)   . Hydrocephalus (HCC)   . Hypertension   . Migraine   . Open-angle glaucoma   . Optic atrophy, both eyes   . Preterm delivery    25 weeks  . Seizures (HCC)    Past Surgical History:  Procedure Laterality Date  . BRAIN SURGERY     And shunt placement  . COLONOSCOPY WITH PROPOFOL N/A 01/02/2020   Procedure: COLONOSCOPY WITH PROPOFOL;  Surgeon: Toney Reil, MD;  Location: [facility] ENDOSCOPY;  Service: Gastroenterology;  Laterality: N/A;  . CRAINOTOMY Left 01/2012   for dural biopsy  . ESOPHAGOGASTRODUODENOSCOPY (EGD) WITH PROPOFOL N/A 01/02/2020   Procedure: ESOPHAGOGASTRODUODENOSCOPY (EGD) WITH PROPOFOL;  Surgeon: Toney Reil, MD;  Location[facility] ENDOSCOPY;  Service: Gastroenterology;  Laterality: N/A;  . EXCISION OF SUBGLOTIC CYST  01/1999  . FRONTAL BOLT PLACEMENT Left 02/2010  . 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 VENTRICULOPERITONEAL SHUNT Right 10/1998  . VENTRICULOPERITONEAL SHUNT  12/2005, 03/2006, 09/2009   REVISIONS    Allergies  Allergies  Allergen Reactions  . Ativan [Lorazepam] Other (See Comments)    combative    History of Present Illness    Dawn Stark is a 19 y.o. female with PMH as above.  She was  previously seen 03/07/2018 by Dr. Mariah Milling of [facility].  At that time, she had been referred by Dr. Shirlee Latch for consultation of sinus tachycardia and syncope.  She had an episode of syncope while at school, during which time her legs were weak.  She denied skipping any meals.  She reportedly fractured her left foot during the fall.  She had episodes of syncope for several months leading up to her visit and in the setting of bowel movements.  She reported abdominal cramping and urgency.  Prior to syncope, she usually felt lightheaded.  She did not drink much fluid, preferring to drink [facility].  On review of EMR, notes indicated low BP 112/80 on atenolol 05/2017 with HR 100 bpm.  She was seen by neurology 05/2017 with HR 95 bpm and BP 114/82.  She was seen by GI 05/2017 with BP 104/72 and HR 101.  Her weight was trending up over the last year, reportedly gaining 25 pounds.  She had chronically elevated heart rate in the low 100s.  As above, she had previously been on atenolol.  She was currently taking metoprolol succinate 50 mg daily and tolerating this well.  She reported chronic constipation since the age of 1.  She reported lots of straining and BRBPR when wiping.  She had a history of hydrocephalus s/p shunt.  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  EKG during her  visit was ST, 96 bpm, no significant ST or T changes.  Recommendation was to take propanolol as needed for shortness of breath, chest tightness, and tachycardia.  In addition, a 2-week monitor was ordered to evaluate heart rhythm and rate; however, on review of the medical record, I am unable to find long-term monitor results.  On 04/15/2020, she went to the emergency room with chest pain and sinus tachycardia.  Recommendation was to follow-up with her primary care doctor and cardiologist for monitoring and echocardiogram.  Vitals at the time showed BP 125/88, HR 92 bpm, 100% on room air.  Weight 53 kg.  Subsequent review  of care everywhere shows labs as below with elevated D-dimer at 722.  High-sensitivity troponin negative at less than 6.  Thyroid profile WNL.  COVID-19 negative.  CMP WNL.  CBC showed elevated WBCs, as present on previous labs.  CTA chest was performed and showed VP shunt present within the subcutaneous soft tissues of the right neck, anterior chest wall, extending over the abdominal left upper quadrant.  There was a 2 cm round focus of hyperlucencies in CT the first within the right apex and similar 2 cm round focus of hyperlucency in the superior segment of the left lower lobe of uncertain etiology.  No pulmonary embolism or CT finding to explain chest pain.  Round foci of hyperlucency within the right apex and left lower lobe are of uncertain etiology but favored to be incidental, possibly sequelae of BPD and patient with history of prematurity.  I am unable to see the EKG; however, the report shows sinus tachycardia with short PR interval and numerical entry shows ventricular rate 105 bpm, PR interval 112 ms, QRS 84, QTc 433.  Earlier EKG shows sinus tachycardia with short PR interval and nonspecific T wave abnormalities with ventricular rate 112 bpm, PR interval 112 ms, QRS 80 ms, QTC 428.  Chest x-Reisner was performed and showed redemonstrated shunt catheter coursing over the right neck, crossing the midline, and terminating coiled in the left upper abdomen but no acute findings.  Today, 04/29/2020, she presents to clinic and reports that she has been having chest pain and racing heart rate on and off for the last 2 to 3 weeks.  She reports that this chest pain is worse when laying down on her back and also pleuritic in nature.  She is unable to estimate a length of time for each of these episodes.  No associated symptoms.  She reports that she is drinking water all day and no [facility].  She states that she is eating well but often skips breakfast.  She denies these episodes as occurring  during a specific time of day or identifying any clear triggers or exacerbating factors other than deeper breathing and laying on her back.  She has been monitoring her blood pressure and heart rate and reports rates from 85 bpm to 121 bpm.  BP 117/81.  She used to wear a CPAP for OSA but has not done so for many years.  She states she used to have a daily headache; however, her headache has resolved within the last 2 months and after receiving brain surgery (craniotomy).  She is not very active and is unable to state if she has worsening CP with exertion.  No presyncope, syncope, or falls.  No orthopnea, PND, abdominal distention, or early satiety.  She reports a desire to get off of her metoprolol XL, she reports that this medication has not helped  with her sinus tachycardia and also makes her feel very fatigued.  She is currently taking this medication at night.  She reports a family history of heart disease with maternal grandmother deceased from heart failure and with heart attack before the age of 79.  Her maternal grandfather also has a history of heart failure and is currently living.  Home Medications    Prior to Admission medications   Medication Sig Start Date End Date Taking? Authorizing Provider  AIMOVIG 70 MG/ML SOAJ SMARTSIG:70 Milligram(s) SUB-Q Once a Month 03/22/20  Yes [provider]  butalbital-acetaminophen-caffeine (FIORICET, ESGIC) 50-325-40 MG tablet butalbital-acetaminophen-caffeine 50 mg-325 mg-40 mg tablet   Yes [provider]  etonogestrel (NEXPLANON) 68 MG IMPL implant 68 mg. 12/22/16  Yes [provider]  linaclotide (LINZESS) 290 MCG CAPS capsule Take 1 capsule (290 mcg total) by mouth daily before breakfast. 04/13/20  Yes Vanga, Loel Dubonnet, MD  metoprolol succinate (TOPROL-XL) 50 MG 24 hr tablet Take 1 tablet (50 mg total) by mouth at bedtime. 04/15/20  Yes McLean-Scocuzza, Pasty Spillers, MD  omeprazole (PRILOSEC) 40 MG capsule Take 1 capsule (40 mg  total) by mouth 2 (two) times daily before a meal. 01/02/20 04/29/20 Yes Vanga, Loel Dubonnet, MD    Review of Systems    She denies dyspnea, pnd, orthopnea, n, v, dizziness, syncope, edema, weight gain, or early satiety.  She reports episodes of chest pain and tachypalpitations that occur on and off throughout the day with exacerbating factors noted to be lying on her back and with deeper breathing.   All other systems reviewed and are otherwise negative except as noted above.  Physical Exam    VS:  BP 104/70 (BP Location: Left Arm, Patient Position: Sitting, Cuff Size: Normal)   Pulse 93   Ht  (1.448 m)   Wt 123 lb (55.8 kg)   SpO2 98%   BMI 26.62 kg/m  , BMI Body mass index is 26.62 kg/m. GEN: Well nourished, well developed, in no acute distress. HEENT: normal. Neck: Supple, no JVD, carotid bruits, or masses. Cardiac: RRR, no murmurs, rubs, or gallops. No clubbing, cyanosis, edema.  Radials/DP/PT 2+ and equal bilaterally.  Respiratory:  Respirations regular and unlabored, clear to auscultation bilaterally. GI: Soft, nontender, nondistended, BS + x 4. MS: no deformity or atrophy. Skin: warm and dry, no rash. Neuro:  Strength and sensation are intact. Psych: Normal affect.  Accessory Clinical Findings    ECG personally reviewed by me today - ST, 93bpm, nonspecific T wave abnormality in inferior leads and likely 2/2 lead placement - no acute changes.  VITALS Reviewed today   Temp Readings from Last 3 Encounters:  04/15/20 99.2 F (37.3 C)  04/13/20 98.5 F (36.9 C) (Oral)  01/12/20 98.6 F (37 C) (Oral)   BP Readings from Last 3 Encounters:  04/29/20 104/70  04/15/20 117/76  04/13/20 121/86   Pulse Readings from Last 3 Encounters:  04/29/20 93  04/15/20 (!) 113  04/13/20 (!) 114    Wt Readings from Last 3 Encounters:  04/29/20 123 lb (55.8 kg)  04/13/20 124 lb 2 oz (56.3 kg)  01/12/20 116 lb (52.6 kg)     LABS  reviewed today   Lab Results  Component  Value Date   WBC 10.2 01/22/2020   HGB 12.6 01/22/2020   HCT 37.8 01/22/2020   MCV 88.7 01/22/2020   PLT 342 01/22/2020   Lab Results  Component Value Date   CREATININE 0.71 01/12/2020   BUN  12 01/12/2020   NA 139 01/12/2020   K 3.4 (L) 01/12/2020   CL 106 01/12/2020   CO2 25 01/12/2020   Lab Results  Component Value Date   ALT 15 10/05/2018   AST 15 10/05/2018   ALKPHOS 100 10/05/2018   BILITOT 0.7 10/05/2018   No results found for: CHOL, HDL, LDLCALC, LDLDIRECT, TRIG, CHOLHDL  No results found for: HGBA1C Lab Results  Component Value Date   TSH 2.15 09/14/2017    Care Everywhere labs from 9/23 reviewed   04/15/2020: D-dimer 722, CMP sodium 136, potassium 3.5, creatinine 0.8, BUN 11, WBC 12.3, hemoglobin 13.1, hematocrit 39.4, platelets 334, Mg 2.0.,  TSH 1.59, free T4 0.98, COVID-19 not detected  STUDIES/PROCEDURES reviewed today   04/15/20 [facility] CT chest PE protocol 1. No pulmonary embolism or CT finding to explain chest pain.  2. Round foci of hyperlucency within the right apex and left lower lobe are  uncertain etiology but favored to be incidental, possibly sequela of BPD in  patient with history of prematurity     Assessment & Plan    Atypical chest pain --No current chest pain.  Notes episodes of racing heart rate and chest pain that have occurred over the last 2 to 3 weeks.  Exacerbating factors include laying down on her back and deeper breathing.  Unable to specify length of time of episodes or identify certain times of the day during which they are worse.  She reports proper hydration and is eating but does skip breakfast.  Risk factors for cardiac etiology include family history with a maternal grandmother that had a heart attack before the age of 36.  Will obtain echo to rule out pericarditis /pericardial effusion /myopericarditis given elevated WBCs on recent labs.  9/23 D-dimer was elevated with subsequent CTA as above without signs of pulmonary embolism.   CTA did find  2 cm round focus of hyperlucencies, however, which should be considered as possibly contributing to her CP, racing HR, and SOB. She also reports a h/o OSA but no longer on a CPAP; therefore, in the future, a referral to pulmonology may be warranted for both workup of 2cm hyperlucencies and sleep apnea. Considered also a GI etiology, though she is on Prilosec 40mg  daily.  --Will obtain echo, which will also allow to assess EF, valvular function, and rule out any acute structural abnormalities.  Given she has not obtained symptomatic relief with her Toprol and reports associated fatigue, if EF normal, consider transition from Toprol to Cardizem at that time.  If EF reduced, she will need further ischemic work-up with Lexiscan or cardiac CTA at the time.  Will defer for now.  Given ST as below, will obtain cardiac monitoring. TSH and electrolytes wnl at the ED. No further labs at this time; however, future labs could include CRP, ANA, RF, and metanephrines. Consider also future referral to pulmonology and GI at RTC if indicated. Further recommendations pending echo.   Sinus tachycardia  --Reports ongoing racing heart rate.  EKG at [facility] have shown ST with short PRi and occasional nonspecific TW changes.  Associated symptoms include pleuritic chest pain.  She is not very active and reports sinus tachycardia occurs mainly at rest.  TSH and electrolytes wnl in the ED but Ddimer elevated with subsequent CTA as above. Future labs as above to be reassessed at RTC. Consider also future referral to pulmonology at RTC as above.  --Will obtain 2-week Zio monitor to rule out any arrhythmia and assess  the burden of her sinus tachycardia.  Obtain echo as above to assess EF and valvular function, as well as rule out any acute structural abnormality or effusion.  She denies symptomatic relief with Toprol-XL and reports associated fatigue.  If EF normal on echo, consider transition from Toprol to Cardizem at that  time.  Continue to recommend hydration and optimal oral intake.  Encouraged physical activity if possible, as deconditioning could certainly be exacerbating her rates.  History of syncope --No recent syncope reported this visit.    History of Sleep apnea --Reports a history of obstructive sleep apnea with central sleep apnea listed in her history.  Previously on a CPAP, which she no longer utilizes.  Consider this is contributing to her symptoms as above.  Given her hyperlucency seen on CTA and symptoms, reassess need for referral to pulmonology at RTC.  She may need a repeat sleep study.  Blindness --Reports that she is not very physically active at baseline.  She does use a service dog; therefore, she is unable to really get very much activity.  Encouraged increasing activity as tolerated.  Abnormal labs: Leukocytosis, elevated D-dimer (04/15/2020) --Elevated WBCs on labs obtained via care everywhere providers.  Recommend further work-up per PCP of leukocytosis.  Elevated D-dimer with subsequent CTA showing hyperlucencies.  Recommend consider pulmonology referral per PCP/further work-up per PCP.  GERD --As above, consider as contributing to sx. Continue PPI.   Medication changes: None Labs ordered: None Studies / Imaging ordered: Echo, Zio XT for cardiac monitoring x2 weeks  Future considerations: Pulmonology referral given hyperlucency is on CTA and history of sleep apnea, GI referral given CP worse with laying flat. Disposition: RTC after echo and Zio completed or sooner if needed  Lennon Alstrom, PA-C 04/29/2020

## 2020-05-04 ENCOUNTER — Other Ambulatory Visit: Payer: Self-pay | Admitting: Physician Assistant

## 2020-05-04 ENCOUNTER — Other Ambulatory Visit: Payer: 59

## 2020-05-04 DIAGNOSIS — R000 Tachycardia, unspecified: Secondary | ICD-10-CM

## 2020-05-04 DIAGNOSIS — R0789 Other chest pain: Secondary | ICD-10-CM

## 2020-05-05 ENCOUNTER — Ambulatory Visit (INDEPENDENT_AMBULATORY_CARE_PROVIDER_SITE_OTHER): Payer: 59

## 2020-05-05 ENCOUNTER — Other Ambulatory Visit: Payer: Self-pay

## 2020-05-05 DIAGNOSIS — R0789 Other chest pain: Secondary | ICD-10-CM | POA: Diagnosis not present

## 2020-05-05 DIAGNOSIS — R000 Tachycardia, unspecified: Secondary | ICD-10-CM

## 2020-05-05 LAB — ECHOCARDIOGRAM COMPLETE
AR max vel: 1.37 cm2
AV Area VTI: 1.42 cm2
AV Area mean vel: 1.33 cm2
AV Mean grad: 3 mmHg
AV Peak grad: 5.5 mmHg
Ao pk vel: 1.17 m/s
Area-P 1/2: 4.54 cm2
Calc EF: 56.8 %
S' Lateral: 2.4 cm
Single Plane A2C EF: 55.7 %
Single Plane A4C EF: 56.1 %

## 2020-05-10 ENCOUNTER — Telehealth: Payer: Self-pay | Admitting: Physician Assistant

## 2020-05-10 ENCOUNTER — Telehealth: Payer: Self-pay | Admitting: Cardiovascular Disease

## 2020-05-10 NOTE — Telephone Encounter (Signed)
Patients mother calling in stating they have still not received the zio monitor that they were supposed to get last week  Please advise

## 2020-05-10 NOTE — Telephone Encounter (Signed)
Lennon Alstrom, PA-C  05/09/2020 10:19 PM EDT     Normal pump function and walls of the heart are moving normally. Valves of the heart are functioning normally. Heart chambers are of normal size and pressure. Reassuring study.

## 2020-05-10 NOTE — Telephone Encounter (Signed)
Patient calling to check on status of ECHO results  

## 2020-05-10 NOTE — Telephone Encounter (Signed)
Spoke with patients mother per release from and requested that if she has not received by this Friday to please give [place] a call back. She verbalized understanding with no further questions at this time.

## 2020-05-10 NOTE — Telephone Encounter (Signed)
I have spoken with the patient regarding her echo results.  I inquired if she had received her ZIO monitor yet and she states she has not seen this.  I advised I will check the ZIO site for the status of the monitor, but if she has not received this in the next few days to please call back.  The patient voices understanding and is agreeable.   Reviewed ZIO site- it does not look like her monitor was ordered. I have ordered this to be shipped to her today.

## 2020-05-15 ENCOUNTER — Emergency Department
Admission: EM | Admit: 2020-05-15 | Discharge: 2020-05-15 | Disposition: A | Discharge status: Home / Self Care | Payer: 59 | Attending: Emergency Medicine | Admitting: Emergency Medicine

## 2020-05-15 ENCOUNTER — Other Ambulatory Visit: Payer: Self-pay | Admitting: Emergency Medicine

## 2020-05-15 DIAGNOSIS — Z79899 Other long term (current) drug therapy: Secondary | ICD-10-CM | POA: Diagnosis not present

## 2020-05-15 DIAGNOSIS — I10 Essential (primary) hypertension: Secondary | ICD-10-CM | POA: Diagnosis not present

## 2020-05-15 DIAGNOSIS — R079 Chest pain, unspecified: Secondary | ICD-10-CM | POA: Diagnosis not present

## 2020-05-15 LAB — COMPREHENSIVE METABOLIC PANEL
ALT: 11 U/L (ref 0–44)
AST: 12 U/L — ABNORMAL LOW (ref 15–41)
Albumin: 4.5 g/dL (ref 3.5–5.0)
Alkaline Phosphatase: 77 U/L (ref 38–126)
Anion gap: 9 (ref 5–15)
BUN: 11 mg/dL (ref 6–20)
CO2: 25 mmol/L (ref 22–32)
Calcium: 9.7 mg/dL (ref 8.9–10.3)
Chloride: 104 mmol/L (ref 98–111)
Creatinine, Ser: 0.71 mg/dL (ref 0.44–1.00)
GFR, Estimated: >60 mL/min (ref >60)
Glucose, Bld: 81 mg/dL (ref 70–99)
Potassium: 3.8 mmol/L (ref 3.5–5.1)
Sodium: 138 mmol/L (ref 135–145)
Total Bilirubin: 1 mg/dL (ref 0.3–1.2)
Total Protein: 7.8 g/dL (ref 6.5–8.1)

## 2020-05-15 LAB — CBC
HCT: 38.2 % (ref 36.0–46.0)
Hemoglobin: 13.2 g/dL (ref 12.0–15.0)
MCH: 30.2 pg (ref 26.0–34.0)
MCHC: 34.6 g/dL (ref 30.0–36.0)
MCV: 87.4 fL (ref 80.0–100.0)
Platelets: 361 10*3/uL (ref 150–400)
RBC: 4.37 MIL/uL (ref 3.87–5.11)
RDW: 11.9 % (ref 11.5–15.5)
WBC: 10.4 10*3/uL (ref 4.0–10.5)
nRBC: 0 % (ref 0.0–0.2)

## 2020-05-15 LAB — TROPONIN I (HIGH SENSITIVITY): Troponin I (High Sensitivity): <2 ng/L (ref <18)

## 2020-05-15 MED ORDER — PANTOPRAZOLE SODIUM 40 MG PO TBEC: 40.0000 mg | DELAYED_RELEASE_TABLET | Freq: Every day | ORAL | 1 refills | Status: DC

## 2020-05-15 MED ORDER — KETOROLAC TROMETHAMINE 30 MG/ML IJ SOLN
30.0000 mg | Freq: Once | INTRAMUSCULAR | Status: AC
  Administered 2020-05-15: 30 mg via INTRAVENOUS
  Filled 2020-05-15: qty 1

## 2020-05-15 MED ORDER — PANTOPRAZOLE SODIUM 40 MG IV SOLR
40.0000 mg | Freq: Once | INTRAVENOUS | Status: AC
  Administered 2020-05-15: 40 mg via INTRAVENOUS
  Filled 2020-05-15: qty 40

## 2020-05-15 NOTE — ED Triage Notes (Signed)
Patient to ED for intermittent chest pain and left arm numbness. Patient alerted her mother that something didn't feel right. Has on external heart monitor since Wednesday to try and capture her palpitations she has complained of. Patient's mother is employed here and states the cardiologist they see is Dr. Lawernce Keas at [place].

## 2020-05-15 NOTE — ED Notes (Addendum)
Pt presents to the ED for CP and L arm numbness that started yesterday. Pt is A&Ox4 and NAD. Pt has a external heart monitor that was placed on Wednesday for palpitations. States that the CP is centralized and comes and go.

## 2020-05-15 NOTE — ED Notes (Signed)
This RN to get bloodwork and mother states that she would like the IV team to stick this pt and use [place]. Nurses up front attempted to stick pt and was unsuccessful. Orders placed for IV team

## 2020-05-15 NOTE — ED Provider Notes (Signed)
[facility] Emergency Department Provider Note   ____________________________________________    I have reviewed the triage vital signs and the nursing notes.   HISTORY  Chief Complaint Chest Pain and Numbness     HPI Dawn Stark is a 19 y.o. female with significant past medical history as detailed below who presents with chest pain.  Patient reports chest pain has been ongoing for nearly a month.  Has had work-up with cardiology thus far without clear etiology.  She describes the pain as pressure-like intermittently with palpitations and sometimes radiation to her left arm.  Review of medical records demonstrates the patient has had normal echocardiogram, is currently wearing a ZIO monitor, had a normal CT angiography recently as well.  No fevers or chills or cough.  No Covid symptoms.  No nausea vomiting or diaphoresis.  Past Medical History:  Diagnosis Date  . Blind    since age 68 y.o   . Central sleep apnea   . Constipation   . GERD (gastroesophageal reflux disease)   . Hydrocephalus (HCC)   . Hypertension   . Migraine   . Open-angle glaucoma   . Optic atrophy, both eyes   . Preterm delivery    25 weeks  . Seizures [facility])     Patient Active Problem List   Diagnosis Date Noted  . Dyspepsia   . Annual physical exam 12/25/2019  . Migraine without aura and without status migrainosus, not intractable 12/23/2018  . Palpitations 09/18/2017  . Sinus tachycardia 09/18/2017  . Syncope 09/18/2017  . 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 Chronic tension-type headache, intractable 06/05/2017  . Chronic migraine 04/02/2017  . Headache disorder 03/19/2017  . Acute right-sided thoracic back pain 03/06/2017  . Right upper quadrant abdominal pain 03/06/2017  . Obstructive hydrocephalus (HCC)   . Blindness   . Complex care coordination 12/06/2016  . Special needs assessment 12/06/2016  . Acute intractable headache 01/26/2016  . Encounter for management  and injection of injectable progestin contraceptive 01/21/2016  . Gastroesophageal reflux disease 04/23/2014  . Chronic constipation 05/23/2013  . Blindness of both eyes 11/05/2012  . 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 Premenopausal menorrhagia 02/26/2012  . Loss of vision 12/21/2010  . Hypertension 12/13/2010  . Open-angle glaucoma 11/24/2010  . Central sleep apnea 10/07/2010  . Preterm delivery 02/25/2010    Past Surgical History:  Procedure Laterality Date  . BRAIN SURGERY     And shunt placement  . COLONOSCOPY WITH PROPOFOL N/A 01/02/2020   Procedure: COLONOSCOPY WITH PROPOFOL;  Surgeon: Toney Reil, MD;  Location: [facility] ENDOSCOPY;  Service: Gastroenterology;  Laterality: N/A;  . CRAINOTOMY Left 01/2012   for dural biopsy  . ESOPHAGOGASTRODUODENOSCOPY (EGD) WITH PROPOFOL N/A 01/02/2020   Procedure: ESOPHAGOGASTRODUODENOSCOPY (EGD) WITH PROPOFOL;  Surgeon: Toney Reil, MD;  Location: [facility] ENDOSCOPY;  Service: Gastroenterology;  Laterality: N/A;  . EXCISION OF SUBGLOTIC CYST  01/1999  . FRONTAL BOLT PLACEMENT Left 02/2010  . 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 VENTRICULOPERITONEAL SHUNT Right 10/1998  . VENTRICULOPERITONEAL SHUNT  12/2005, 03/2006, 09/2009   REVISIONS    Prior to Admission medications   Medication Sig Start Date End Date Taking? Authorizing Provider  AIMOVIG 70 MG/ML SOAJ SMARTSIG:70 Milligram(s) SUB-Q Once a Month 03/22/20   [provider]  butalbital-acetaminophen-caffeine (FIORICET, ESGIC) 50-325-40 MG tablet butalbital-acetaminophen-caffeine 50 mg-325 mg-40 mg tablet    [provider]  etonogestrel (NEXPLANON) 68 MG IMPL implant 68 mg. 12/22/16  [provider]  linaclotide Karlene Einstein) 290 MCG CAPS capsule Take 1 capsule (290 mcg total) by mouth daily before breakfast. 04/13/20   Vanga, Loel Dubonnet, MD  metoprolol succinate (TOPROL-XL) 50 MG 24 hr tablet Take 1 tablet (50 mg total) by mouth at bedtime. 04/15/20    McLean-Scocuzza, Pasty Spillers, MD  pantoprazole (PROTONIX) 40 MG tablet Take 1 tablet (40 mg total) by mouth daily. 05/15/20 05/15/21  Jene Every, MD  omeprazole (PRILOSEC) 40 MG capsule Take 1 capsule (40 mg total) by mouth 2 (two) times daily before a meal. 01/02/20 05/15/20  Vanga, Loel Dubonnet, MD     Allergies Ativan [lorazepam]  Family History  Problem Relation Age of Onset  . Heart attack Father   . Heart disease Father     Social History Social History   Tobacco Use  . Smoking status: Never Smoker  . Smokeless tobacco: Never Used  Vaping Use  . Vaping Use: Never used  Substance Use Topics  . Alcohol use: No  . Drug use: No    Review of Systems  Constitutional: No fever/chills Eyes: No visual changes.  ENT: No sore throat. Cardiovascular: As above Respiratory: Denies shortness of breath. Gastrointestinal: No abdominal pain.  Genitourinary: Negative for dysuria. Musculoskeletal: Negative for back pain. Skin: Negative for rash. Neurological: Negative for headaches or weakness   ____________________________________________   PHYSICAL EXAM:  VITAL SIGNS: ED Triage Vitals [05/15/20 1255]  Enc Vitals Group     BP 126/71     Pulse Rate 78     Resp 18     Temp 98.5 F (36.9 C)     Temp Source Oral     SpO2 98 %     Weight 51.7 kg (114 lb)     Height 1.448 m (4\' 9" )     Head Circumference      Peak Flow      Pain Score 3     Pain Loc      Pain Edu?      Excl. in GC?     Constitutional: Alert and oriented.  . Nose: No congestion/rhinnorhea. Mouth/Throat: Mucous membranes are moist.    Cardiovascular: Normal rate, regular rhythm.  Good peripheral circulation. Respiratory: Normal respiratory effort.  No retractions.  Gastrointestinal: Soft and nontender. No distention..  Musculoskeletal Warm and well perfused Neurologic:  Normal speech and language. No gross focal neurologic deficits are appreciated.  Skin:  Skin is warm, dry and intact. No rash  noted. Psychiatric: Mood and affect are normal. Speech and behavior are normal.  ____________________________________________   LABS (all labs ordered are listed, but only abnormal results are displayed)  Labs Reviewed  COMPREHENSIVE METABOLIC PANEL - Abnormal; Notable for the following components:      Result Value   AST 12 (*)    All other components within normal limits  CBC  TROPONIN I (HIGH SENSITIVITY)   ____________________________________________  EKG  ED ECG REPORT I, , the attending physician, personally viewed and interpreted this ECG.  Date: 05/15/2020  Rhythm: normal sinus rhythm QRS Axis: normal Intervals: normal ST/T Wave abnormalities: Nonspecific change Narrative Interpretation: no evidence of acute ischemia  ____________________________________________  RADIOLOGY  None ____________________________________________   PROCEDURES  Procedure(s) performed: No  Procedures   Critical Care performed: No ____________________________________________   INITIAL IMPRESSION / ASSESSMENT AND PLAN / ED COURSE  Pertinent labs & imaging results that were available during my care of the patient were reviewed by me and considered in my medical decision  making (see chart for details).  Patient with history as noted above including VP shunt presents with complaints of chest pain.  Her pain is consistent with the pain that she has been having which is been worked up by cardiology.  Does seem to be worse when lying down.  Has had normal echocardiogram, normal CT angiography.  EKG today is reassuring and unchanged from prior EKG at cardiologist office.  Overall well-appearing with reassuring vitals, no tachycardia.  Pending labs including high-sensitivity troponin.  Will treat with IV Toradol.  Patient had significant provement with IV Toradol and sitting up.  High-sensitivity troponin is normal, undetectable.  Given recent normal echo, CTA,  troponin I suspect her chest discomfort may be GERD related as she did have an endoscopy that showed gastric erosions.  We will switch her from omeprazole to Protonix to see if this improves,  ____________________________________________   FINAL CLINICAL IMPRESSION(S) / ED DIAGNOSES  Final diagnoses:  Chest pain, unspecified type        Note:  This document was prepared using Dragon voice recognition software and may include unintentional dictation errors.   Jene Every, MD 05/15/20 [phone]

## 2020-05-15 NOTE — ED Notes (Signed)
IV team at bedside 

## 2020-05-15 NOTE — ED Notes (Signed)
Pt transported to [facility] at this time.  

## 2020-05-17 ENCOUNTER — Telehealth: Payer: Self-pay

## 2020-05-17 NOTE — Telephone Encounter (Signed)
Transition Care Management Follow-up Telephone Call  Date of discharge and from where: 05/15/2020 from [facility]  How have you been since you were released from the hospital? Pt stated that she is feeling good.   Any questions or concerns? No  Items Reviewed:  Did the pt receive and understand the discharge instructions provided? Yes   Medications obtained and verified? No   Other? No   Any new allergies since your discharge? No   Dietary orders reviewed?   Do you have support at home? Yes   Functional Questionnaire: (I = Independent and D = Dependent) ADLs: I  Bathing/Dressing- I  Meal Prep- I  Eating- I  Maintaining continence- I  Transferring/Ambulation- I  Managing Meds- I   Follow up appointments reviewed:   PCP Hospital f/u appt confirmed? No    Specialist Hospital f/u appt confirmed? No    Are transportation arrangements needed? No   If their condition worsens, is the pt aware to call PCP or go to the Emergency Dept.? Yes  Was the patient provided with contact information for the PCP's office or ED? Yes  Was to pt encouraged to call back with questions or concerns? Yes

## 2020-05-19 ENCOUNTER — Other Ambulatory Visit: Payer: Self-pay

## 2020-05-19 ENCOUNTER — Encounter: Payer: Self-pay | Admitting: Physician Assistant

## 2020-05-19 ENCOUNTER — Ambulatory Visit (INDEPENDENT_AMBULATORY_CARE_PROVIDER_SITE_OTHER): Payer: 59 | Admitting: Physician Assistant

## 2020-05-19 VITALS — BP 120/80 | HR 88 | Ht <= 58 in | Wt 120.0 lb

## 2020-05-19 DIAGNOSIS — K219 Gastro-esophageal reflux disease without esophagitis: Secondary | ICD-10-CM | POA: Diagnosis not present

## 2020-05-19 DIAGNOSIS — R002 Palpitations: Secondary | ICD-10-CM

## 2020-05-19 DIAGNOSIS — H543 Unqualified visual loss, both eyes: Secondary | ICD-10-CM

## 2020-05-19 DIAGNOSIS — R000 Tachycardia, unspecified: Secondary | ICD-10-CM | POA: Diagnosis not present

## 2020-05-19 MED ORDER — DILTIAZEM HCL ER COATED BEADS 240 MG PO CP24: 240.0000 mg | ORAL_CAPSULE | Freq: Every day | ORAL | 5 refills | Status: DC

## 2020-05-19 NOTE — Patient Instructions (Signed)
Medication Instructions:   Your physician has recommended you make the following change in your medication:   1.  STOP taking your metoprolol succinate (TOPROL-XL) 50 MG 24 hr tablet.  2.  START taking diltiazem (CARDIZEM CD) 240 MG 24 hr capsule: Take 1 capsule (240 mg total) by mouth daily.  **PLEASE CALL OUR OFFICE OR SEND A MYCHART MESSAGE TO LET us KNOW IF THIS MEDICATION IS HELPING YOUR PALPITATIONS. (WE CAN ADJUST THE DOSE IF NEEDED)   *If you need a refill on your cardiac medications before your next appointment, please call your pharmacy*   Lab Work: None Ordered If you have labs (blood work) drawn today and your tests are completely normal, you will receive your results only by: Marland Kitchen MyChart Message (if you have MyChart) OR . A paper copy in the mail If you have any lab test that is abnormal or we need to change your treatment, we will call you to review the results.   Testing/Procedures: None Ordered   Follow-Up: At [facility], you and your health needs are our priority.  As part of our continuing mission to provide you with exceptional heart care, we have created designated Provider Care Teams.  These Care Teams include your primary Cardiologist (physician) and Advanced Practice Providers (APPs -  Physician Assistants and Nurse Practitioners) who all work together to provide you with the care you need, when you need it.  We recommend signing up for the patient portal called "MyChart".  Sign up information is provided on this After Visit Summary.  MyChart is used to connect with patients for Virtual Visits (Telemedicine).  Patients are able to view lab/test results, encounter notes, upcoming appointments, etc.  Non-urgent messages can be sent to your provider as well.   To learn more about what you can do with MyChart, go to [email or web address].    Your next appointment:   3-6 month(s)  The format for your next appointment:   In Person  Provider:   Marisue Ivan, PA-C   Other Instructions

## 2020-05-19 NOTE — Progress Notes (Signed)
Office Visit    Patient Name: Dawn Stark Date of Encounter: 05/19/2020  Primary Care Provider:  McLean-Scocuzza, Pasty Spillersracy N, MD Primary Cardiologist:  Julien Nordmannimothy Gollan, MD  Chief Complaint    Chief Complaint  Patient presents with   office visit    ED F/U-chest pain. Symptoms have resolved since switching from omeprazole to pantoprazole. Patient wearing ZIO monitor; Meds verbally reviewed with patient.    19 year old female with history of syncope, chronic constipation, blindness, headaches, previously seen 03/07/2018, and here today for ED follow-up of chest pain and palpitations.  Past Medical History    Past Medical History:  Diagnosis Date   Blind    since age 19 y.o    Central sleep apnea    Constipation    GERD (gastroesophageal reflux disease)    Hydrocephalus (HCC)    Hypertension    Migraine    Open-angle glaucoma    Optic atrophy, both eyes    Preterm delivery    25 weeks   Seizures (HCC)    Past Surgical History:  Procedure Laterality Date   BRAIN SURGERY     And shunt placement   COLONOSCOPY WITH PROPOFOL N/A 01/02/2020   Procedure: COLONOSCOPY WITH PROPOFOL;  Surgeon: Toney ReilVanga, Rohini Reddy, MD;  Location: [facility] ENDOSCOPY;  Service: Gastroenterology;  Laterality: N/A;   CRAINOTOMY Left 01/2012   for dural biopsy   ESOPHAGOGASTRODUODENOSCOPY (EGD) WITH PROPOFOL N/A 01/02/2020   Procedure: ESOPHAGOGASTRODUODENOSCOPY (EGD) WITH PROPOFOL;  Surgeon: Toney ReilVanga, Rohini Reddy, MD;  [facility];  Service: Gastroenterology;  Laterality: N/A;   EXCISION OF SUBGLOTIC CYST  01/1999   FRONTAL BOLT PLACEMENT Left 02/2010   SUB-GALEAL VENTRICULAR SHUNT  09/1998   VENTRICULOPERITONEAL SHUNT Right 10/1998   VENTRICULOPERITONEAL SHUNT  12/2005, 03/2006, 09/2009   REVISIONS    Allergies  Allergies  Allergen Reactions   Ativan [Lorazepam] Other (See Comments)    combative    History of Present Illness    Dawn Stark is a 19 y.o.  female with PMH as above.  She reports a family history of heart disease with maternal grandmother deceased from heart failure and with heart attack before the age of 550.  Her maternal grandfather also has a history of heart failure and is currently living.  She was previously seen 03/07/2018 by Dr[facility] Mariah [facility]ollan [facility].  At that time, she had been referred by Dr. Shirlee LatchMcLean for consultation of sinus tachycardia and syncope.  She had an episode of syncope while at school, during which time her legs were weak.  She denied skipping any meals.  She reportedly fractured her left foot during the fall.  She had episodes of syncope for several months leading up to her visit and in the setting of bowel movements.  She reported abdominal cramping and urgency.  Prior to syncope, she usually felt lightheaded.  She did not drink much fluid, preferring to drink [facility].  On review of EMR, notes indicated low BP 112/80 on atenolol 05/2017 with HR 100 bpm.  She was seen by neurology 05/2017 with HR 95 bpm and BP 114/82.  She was seen by GI 05/2017 with BP 104/72 and HR 101.  Her weight was trending up over the last year, reportedly gaining 25 pounds.  She had chronically elevated heart rate in the low 100s.  As above, she had previously been on atenolol.  She was currently taking metoprolol succinate 50 mg daily and tolerating this well.  She reported chronic constipation since the age  of 6.  She reported lots of straining and BRBPR when wiping.  She had a history of hydrocephalus s/p shunt.  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  EKG during her visit was ST, 96 bpm, no significant ST or T changes.  Recommendation was to take propanolol as needed for shortness of breath, chest tightness, and tachycardia.  In addition, a 2-week monitor was ordered to evaluate heart rhythm and rate; however, on review of the medical record, I am unable to find long-term monitor results.  On 04/15/2020, she went to the  emergency room with chest pain and sinus tachycardia.  Recommendation was to follow-up with her primary care doctor and cardiologist for monitoring and echocardiogram.  Vitals at the time showed BP 125/88, HR 92 bpm, 100% on room air.  Weight 53 kg.  Subsequent review of care everywhere shows labs as below with elevated D-dimer at 722.  High-sensitivity troponin negative at less than 6.  Thyroid profile WNL.  COVID-19 negative.  CMP WNL.  CBC showed elevated WBCs, as present on previous labs.  CTA chest was performed and showed VP shunt present within the subcutaneous soft tissues of the right neck, anterior chest wall, extending over the abdominal left upper quadrant.  There was a 2 cm round focus of hyperlucencies in CT the first within the right apex and similar 2 cm round focus of hyperlucency in the superior segment of the left lower lobe of uncertain etiology.  No pulmonary embolism or CT finding to explain chest pain.  Round foci of hyperlucency within the right apex and left lower lobe are of uncertain etiology but favored to be incidental, possibly sequelae of BPD and patient with history of prematurity.  I am unable to see the EKG; however, the report shows sinus tachycardia with short PR interval and numerical entry shows ventricular rate 105 bpm, PR interval 112 ms, QRS 84, QTc 433.  Earlier EKG shows sinus tachycardia with short PR interval and nonspecific T wave abnormalities with ventricular rate 112 bpm, PR interval 112 ms, QRS 80 ms, QTC 428.  Chest x-Hedding was performed and showed redemonstrated shunt catheter coursing over the right neck, crossing the midline, and terminating coiled in the left upper abdomen but no acute findings.  Seen 04/29/2020 with chest pain and racing heart rate on and off for the previous 2 to 3 weeks.  CP was pleuritic and worse when laying down.  She was not drinking [facility].  She reported home HR 85 bpm -121 bpm.  BP 117/81.  She used to wear a CPAP for OSA.  She  reported resolution of her previous morning headache with her craniotomy.  She was taking metoprolol at night and wished to get off of this medication, as it made her fatigued and she did not feel it controlled her palpitations.  Recommendation was for echo, which subsequently showed normal EF, as well as Holter monitoring for 2 weeks.  On 05/15/2020, she presented to the ED with report of CP. EKG was without acute ST/T changes. High-sensitivity troponin was noted to be normal.   She received IV Toradol with some improvement in her symptoms.  Chest pain was thought 2/2 GERD and her medications changed from omeprazole to Protonix to see if this alleviated her discomfort.  Today, 05/19/2020, she returns to clinic and notes resolution of her chest pain.  She states that she is set up to see GI/Dr. Allegra Lai.  Her mother joins her and states  that she occasionally has breakthrough reflux.  She sometimes takes her Protonix twice per day, rather than once per day, with recommendations as below.  She continues to report fatigue on metoprolol, as well as breakthrough tachypalpitations.  She presents today with ZIO monitor in place; however, it appears to be falling off of her skin, which she states has been ongoing since chatting with the monitor on, though she did cover it with cellophane.  She denies any chest pain, dizziness, syncope, orthopnea, PND, or early satiety.  No lower extremity edema.  She reports medication compliance with her Toprol.  No signs or symptoms of bleeding.  Home Medications    Prior to Admission medications   Medication Sig Start Date End Date Taking? Authorizing Provider  AIMOVIG 70 MG/ML SOAJ SMARTSIG:70 Milligram(s) SUB-Q Once a Month 03/22/20  Yes [provider]  butalbital-acetaminophen-caffeine (FIORICET, ESGIC) 50-325-40 MG tablet butalbital-acetaminophen-caffeine 50 mg-325 mg-40 mg tablet   Yes [provider]  etonogestrel (NEXPLANON) 68 MG IMPL implant 68 mg.  12/22/16  Yes [provider]  linaclotide (LINZESS) 290 MCG CAPS capsule Take 1 capsule (290 mcg total) by mouth daily before breakfast. 04/13/20  Yes Vanga, Loel Dubonnet, MD  metoprolol succinate (TOPROL-XL) 50 MG 24 hr tablet Take 1 tablet (50 mg total) by mouth at bedtime. 04/15/20  Yes McLean-Scocuzza, Pasty Spillers, MD  omeprazole (PRILOSEC) 40 MG capsule Take 1 capsule (40 mg total) by mouth 2 (two) times daily before a meal. 01/02/20 04/29/20 Yes Vanga, Loel Dubonnet, MD    Review of Systems    She denies chest pain, dyspnea, pnd, orthopnea, n, v, dizziness, syncope, edema, weight gain, or early satiety.  She reports episodes of breakthrough reflux and tachypalpitations that occur on and off throughout the day.   All other systems reviewed and are otherwise negative except as noted above.  Physical Exam    VS:  BP 120/80 (BP Location: Left Arm, Patient Position: Sitting, Cuff Size: Normal)    Pulse 88    Ht 4\' 9"  (1.448 m)    Wt 120 lb (54.4 kg)    SpO2 98%    BMI 25.97 kg/m  , BMI Body mass index is 25.97 kg/m. GEN: Well nourished, well developed, in no acute distress. HEENT: normal. Neck: Supple, no JVD, carotid bruits, or masses. Cardiac: RRR, no murmurs, rubs, or gallops. No clubbing, cyanosis, edema.  Radials/DP/PT 2+ and equal bilaterally.  Respiratory:  Respirations regular and unlabored, clear to auscultation bilaterally. GI: Soft, nontender, nondistended, BS + x 4. MS: no deformity or atrophy. Skin: warm and dry, no rash. Neuro:  Strength and sensation are intact. Psych: Normal affect.  Accessory Clinical Findings    ECG personally reviewed by me today -no new tracings- no acute changes.  VITALS Reviewed today   Temp Readings from Last 3 Encounters:  05/15/20 98.5 F (36.9 C) (Oral)  04/15/20 99.2 F (37.3 C)  04/13/20 98.5 F (36.9 C) (Oral)   BP Readings from Last 3 Encounters:  05/19/20 120/80  05/15/20 (!) 128/94  04/29/20 104/70   Pulse Readings from  Last 3 Encounters:  05/19/20 88  05/15/20 73  04/29/20 93    Wt Readings from Last 3 Encounters:  05/19/20 120 lb (54.4 kg)  05/15/20 114 lb (51.7 kg)  04/29/20 123 lb (55.8 kg)     LABS  reviewed today   Lab Results  Component Value Date   WBC 10.4 05/15/2020   HGB 13.2 05/15/2020   HCT 38.2 05/15/2020  MCV 87.4 05/15/2020   PLT 361 05/15/2020   Lab Results  Component Value Date   CREATININE 0.71 05/15/2020   BUN 11 05/15/2020   NA 138 05/15/2020   K 3.8 05/15/2020   CL 104 05/15/2020   CO2 25 05/15/2020   Lab Results  Component Value Date   ALT 11 05/15/2020   AST 12 (L) 05/15/2020   ALKPHOS 77 05/15/2020   BILITOT 1.0 05/15/2020   No results found for: CHOL, HDL, LDLCALC, LDLDIRECT, TRIG, CHOLHDL  No results found for: HGBA1C Lab Results  Component Value Date   TSH 2.15 09/14/2017    Care Everywhere labs from 9/23 reviewed   04/15/2020: D-dimer 722, CMP sodium 136, potassium 3.5, creatinine 0.8, BUN 11, WBC 12.3, hemoglobin 13.1, hematocrit 39.4, platelets 334, Mg 2.0.,  TSH 1.59, free T4 0.98, COVID-19 not detected  STUDIES/PROCEDURES reviewed today   Echocardiogram 05/05/2020 1. Left ventricular ejection fraction, by estimation, is 55 to 60%. The  left ventricle has normal function. The left ventricle has no regional  wall motion abnormalities. Left ventricular diastolic parameters were  normal.  2. Right ventricular systolic function is normal. The right ventricular  size is normal.  3. The mitral valve is normal in structure. No evidence of mitral valve  regurgitation. No evidence of mitral stenosis.  4. The aortic valve is normal in structure. Aortic valve regurgitation is  not visualized. No aortic stenosis is present.   04/15/20 [facility] CT chest PE protocol 1. No pulmonary embolism or CT finding to explain chest pain.  2. Round foci of hyperlucency within the right apex and left lower lobe are  uncertain etiology but favored to be  incidental, possibly sequela of BPD in  patient with history of prematurity     Assessment & Plan    Atypical chest pain, suspected GERD --No current chest pain, which had atypical features with etiology unknown as above in HPI.   --Echo with normal EF and was without significant findings.  --Labs without significant findings. --ZIO monitor still pending as above. --Suspect GI etiology of CP as outlined in previous notes, and given her CP worsened with laying down and has improved since transitioned from Prilosec to Protonix 40 mg daily.  She reports taking one additional Protonix with breakthrough GERD symptoms. As Protonix is a renally processed medication with long term effects including osteoporosis, she was encouraged to reach out to GI regarding this need for 2 Protonix daily.  It was discussed that lifestyle/changes are likely preferred over an additional Protonix, given her young age.  Will defer to GI. --Given normal echo as above, as well as improvement in her symptoms, we will defer further ischemic work-up at this time.  She will contact the office if she has any worsening or new symptoms.  Sinus tachycardia  --Reports ongoing racing heart rate.  As previously discussed, deconditioning is likely playing at least a part in this tachycardic rate, given she is not active at baseline. --She is wearing a 2-week Zio monitor to rule out any arrhythmia and assess the burden of her sinus tachycardia; however, on exam today, the adhesive is no longer sticking to her skin.  We will plan to review the 1 week of ZIO monitoring data that the monitor has thus far collected and placed an additional monitor if this data is not sufficient.   --She reports fatigue and lack of relief with Toprol.  Given her desire to transition off of Toprol-XL,with normal EF as above, as well  as our plan to end her monitoring, we will change Toprol 50 mg to Cardizem 240mg  today and to be titrated as needed. She will call  the office if her rates are not well controlled on this dose within 2 weeks, at which time we can uptitrate the dose to Cardizem 360 mg daily.  Encouraged physical activity if possible, as deconditioning could certainly be exacerbating her rates.  History of syncope --No recent syncope reported this visit.    History of Sleep apnea --Reports a history of obstructive sleep apnea with central sleep apnea listed in her history.  Previously on a CPAP, which she no longer utilizes. Given her hyperlucency seen on CTA /symptoms, as well as her previous history of sleep apnea, discussed referral to pulmonology at her previous visit.  We will continue to defer referral at this time.  Blindness --Reports that she is not very physically active at baseline.  She does use a service dog; therefore, she is unable to really get very much activity.  Encouraged increasing activity as tolerated.  GERD --As above.  She reports improvement with transition from omeprazole to Protonix on 10/23 and after her most recent visit to the ED for chest pain.  Continue PPI.  Continue to follow-up with GI as outlined above.    Medication changes: Discontinue Toprol 50 mg daily.  Start Cardizem 240 mg daily. Labs ordered: None Studies / Imaging ordered: Pending ZIO monitoring results Disposition: RTC 3 to 6 months or sooner if needed to reassess symptoms with change from Toprol to Cardizem, as well as discussed the monitoring results  11/23, PA-C 05/19/2020

## 2020-05-21 ENCOUNTER — Telehealth: Payer: Self-pay | Admitting: Gastroenterology

## 2020-05-21 NOTE — Telephone Encounter (Signed)
-----   Message from Toney Reil, MD sent at 05/21/2020 11:12 AM EDT ----- Regarding: Follow-up Happy to see her.  Joni Reining or Irving Burton, please call her and set up a follow-up, okay to overbook in next couple of weeks, if patient is agreeable  RV ----- Message ----- From: McLean-Scocuzza, Pasty Spillers, MD Sent: 05/17/2020   6:32 AM EDT To: Toney Reil, MD  Pt had ED visit for CP and Ed thinks GI related with EGD + erosions   Can your office reach out to patient for f/u?   Thanks [employer]

## 2020-05-21 NOTE — Telephone Encounter (Signed)
LVM for patient to call our office to schedule a follow up appointment with Dr. Allegra Lai. Per Dr. Allegra Lai

## 2020-05-24 MED ORDER — METOPROLOL SUCCINATE ER 50 MG PO TB24: 50.0000 mg | ORAL_TABLET | Freq: Every day | ORAL | 3 refills | Status: DC

## 2020-05-25 ENCOUNTER — Other Ambulatory Visit: Payer: Self-pay

## 2020-05-25 ENCOUNTER — Encounter: Payer: Self-pay | Admitting: Internal Medicine

## 2020-05-25 ENCOUNTER — Ambulatory Visit (INDEPENDENT_AMBULATORY_CARE_PROVIDER_SITE_OTHER): Payer: 59 | Admitting: Gastroenterology

## 2020-05-25 ENCOUNTER — Encounter: Payer: Self-pay | Admitting: Gastroenterology

## 2020-05-25 VITALS — BP 131/92 | HR 100 | Temp 98.6°F | Ht <= 58 in | Wt 122.4 lb

## 2020-05-25 DIAGNOSIS — R0789 Other chest pain: Secondary | ICD-10-CM

## 2020-05-25 DIAGNOSIS — R12 Heartburn: Secondary | ICD-10-CM

## 2020-05-25 NOTE — Progress Notes (Signed)
Dawn Repress, MD [address]  [address]  [place], [place] [address]  Main: [phone]  Fax: [phone]    Gastroenterology Consultation  Referring Provider:     McLean-Scocuzza, French Stark * Primary Care Physician:  McLean-Scocuzza, Pasty Spillers, MD Primary Gastroenterologist:  Dr. Arlyss Stark Reason for Consultation:     Chronic constipation and atypical chest pain        HPI:   Dawn Stark is a 19 y.o. y/o female referred by Dr. Judie Grieve, Pasty Spillers, MD  for consultation & management of Chronic constipation. This has started since age of 62. She has history of hydrocephalus status post shunt placement. She is legally blind and [place] [facility] [place] [facility] [place] [facility] [employer] She is accompanied by her mom today. She has tried MiraLAX for several years prescribed by her pediatrician which does not help. She is also prescribed Dulcolax. She has a BM every one to 2 weeks which are hard in consistency, spends lot of time on commode associated with straining and burning, blood on wiping, feels like the hemorrhoids prolapse when she pushes hard, spontaneously reduce only partially after defecation, also reports bloating and lower abdominal discomfort. Her TSH was normal in the past. She is on Pepcid for GERD  X-Falconi abdomen in the past revealed significant stool burden.  Follow-up visit 05/28/2017: She tried linaclotide 290 MCG daily and it worked for the first 2 weeks, she was having one to 2 bowel movements daily without straining and they were soft and formed. But for the last 2 weeks she thinks the affect has worn off. She has been taking MiraLAX at bedtime. She thinks adding fiber such as fiber gummies she tried in the past were helping. She still continues to take linaclotide in the morning before breakfast. Her last bowel movement was Saturday and feels like she completely emptied. Now her symptoms of bloating, and rectal discomfort have returned.  Follow-up visit  12/18/2017 She is accompanied by her father today. She reports that her PCP changed in appetite to 145 MCG which is not emptying her bowels completely. She reports that she was having 4-5 loose bowel movements associated with complete emptying on higher dose. She is asking if she could go back to 290 MCG daily. She otherwise denies any complaints today  Follow-up visit 12/16/2019 Patient is here with her parents for follow-up of constipation.  Patient ran out of Linzess about 2 months ago.  Her constipation is worse.  She is hardly having any bowel movements, last bowel movement was more than a week ago.  She also reports significant abdominal bloating associated with upper abdominal discomfort, nocturnal heartburn.  She is taking omeprazole 40 mg before dinner.  She denies any rectal bleeding. She reports that Linzess 290 MCG was helping with constipation  Follow-up visit 04/14/2019 Patient had an upper endoscopy and colonoscopy which were unremarkable. She is now suffering from severe constipation, her last BM was yesterday but incomplete.  She had to increase the dose on Linzess to 2 capsules daily.  Reports abdominal bloating and weight gain.  Her diet is devoid of fiber.  Follow-up visit 05/25/2020 Patient went to ER on 05/15/2020 secondary to chest pain radiating to left arm, that has been ongoing for 1 month with intermittent palpitations.  Cardiac etiology was ruled out and it was thought to be possible reflux.  Her Prilosec was switched to Protonix once a day.  Patient reports that her symptoms have been alleviated since then.  She is currently taking Protonix 40 mg once daily.  She is not having any more chest pain or heartburn.  She also reports that her constipation is better on Linzess 290 MCG daily.  During last visit, I have given her GoLYTELY bowel cleanout and it worked well for her.  She did not have any GI surgeries No family history of colon cancer or other GI malignancy  GI  Procedures:  EGD and colonoscopy 01/02/2020  - Normal duodenal bulb and second portion of the duodenum. - Erosive gastropathy with active bleeding. - Non-bleeding gastric ulcer with a clean ulcer base (Forrest Class III). Biopsied. Clip (MR conditional) was placed. - Esophagogastric landmarks identified. - Normal gastroesophageal junction and esophagus.  - The entire examined colon is normal. - The examined portion of the ileum was normal. - The distal rectum and anal verge are normal on retroflexion view. - No specimens collected.  DIAGNOSIS:  A. STOMACH; COLD BIOPSY:  - ANTRAL MUCOSA WITH CHANGES CONSISTENT WITH HEALING MUCOSAL INJURY.  - UNREMARKABLE OXYNTIC MUCOSA.  - NEGATIVE FOR H. PYLORI, DYSPLASIA, AND MALIGNANCY.   Past Medical History:  Diagnosis Date  . Blind    since age 76 y.o   . Central sleep apnea   . Constipation   . GERD (gastroesophageal reflux disease)   . Hydrocephalus (HCC)   . Hypertension   . Migraine   . Open-angle glaucoma   . Optic atrophy, both eyes   . Preterm delivery    25 weeks  . Seizures (HCC)     Past Surgical History:  Procedure Laterality Date  . BRAIN SURGERY     And shunt placement  . COLONOSCOPY WITH PROPOFOL N/A 01/02/2020   Procedure: COLONOSCOPY WITH PROPOFOL;  Surgeon: Toney Reil, MD;  Location: [facility] ENDOSCOPY;  Service: Gastroenterology;  Laterality: N/A;  . CRAINOTOMY Left 01/2012   for dural biopsy  . ESOPHAGOGASTRODUODENOSCOPY (EGD) WITH PROPOFOL N/A 01/02/2020   Procedure: ESOPHAGOGASTRODUODENOSCOPY (EGD) WITH PROPOFOL;  Surgeon: Toney Reil, MD;  Location: [facility] ENDOSCOPY;  Service: Gastroenterology;  Laterality: N/A;  . EXCISION OF SUBGLOTIC CYST  01/1999  . FRONTAL BOLT PLACEMENT Left 02/2010  . 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 VENTRICULOPERITONEAL SHUNT Right 10/1998  . VENTRICULOPERITONEAL SHUNT  12/2005, 03/2006, 09/2009   REVISIONS    Current Outpatient Medications:  .  AIMOVIG 70 MG/ML  SOAJ, SMARTSIG:70 Milligram(s) SUB-Q Once a Month, Disp: , Rfl:  .  butalbital-acetaminophen-caffeine (FIORICET, ESGIC) 50-325-40 MG tablet, as needed. , Disp: , Rfl:  .  etonogestrel (NEXPLANON) 68 MG IMPL implant, 68 mg., Disp: , Rfl:  .  linaclotide (LINZESS) 290 MCG CAPS capsule, Take 1 capsule (290 mcg total) by mouth daily before breakfast., Disp: 90 capsule, Rfl: 3 .  metoprolol succinate (TOPROL-XL) 50 MG 24 hr tablet, Take 1 tablet (50 mg total) by mouth daily. Take with or immediately following a meal., Disp: 90 tablet, Rfl: 3 .  pantoprazole (PROTONIX) 40 MG tablet, Take 1 tablet (40 mg total) by mouth daily., Disp: 30 tablet, Rfl: 1    Family History  Problem Relation Age of Onset  . Heart attack Father   . Heart disease Father      Social History   Tobacco Use  . Smoking status: Never Smoker  . Smokeless tobacco: Never Used  Vaping Use  . Vaping Use: Never used  Substance Use Topics  . Alcohol use: Yes    Comment: wine-occassionally  . Drug use: No    Allergies as  of 05/25/2020 - Review Complete 05/25/2020  Allergen Reaction Noted  . Ativan [lorazepam] Other (See Comments) 05/06/2016    Review of Systems:    All systems reviewed and negative except where noted in HPI.   Physical Exam:  BP (!) 131/92 (BP Location: Left Arm, Patient Position: Sitting, Cuff Size: Normal)   Pulse 100   Temp 98.6 F (37 C) (Oral)   Ht 4\' 9"  (1.448 m)   Wt 122 lb 6 oz (55.5 kg)   BMI 26.48 kg/m  No LMP recorded. Patient has had an implant.  General:   Alert,  Well-developed, well-nourished, pleasant and cooperative in NAD Head:  Normocephalic and atraumatic. Eyes:  Sclera clear, no icterus.   Conjunctiva pink. Ears:  Normal auditory acuity. Nose:  No deformity, discharge, or lesions. Mouth:  No deformity or lesions,oropharynx pink & moist. Neck:  Supple; no masses or thyromegaly. Lungs:  Respirations even and unlabored.  Clear throughout to auscultation.   No wheezes,  crackles, or rhonchi. No acute distress. Heart:  Regular rate and rhythm; no murmurs, clicks, rubs, or gallops. Abdomen:  Normal bowel sounds.  No bruits.  Soft, moderately distended, nontender, without masses, hepatosplenomegaly or hernias noted.  No guarding or rebound tenderness.   Rectal: Nor performed Msk:  Symmetrical without gross deformities. Good, equal movement & strength bilaterally. Pulses:  Normal pulses noted. Extremities:  No clubbing or edema.  No cyanosis. Neurologic:  Alert and oriented x3;  grossly normal neurologically. Skin:  Intact without significant lesions or rashes. No jaundice. Psych:  Alert and cooperative. Normal mood and affect.  Imaging Studies: No recent abdominal imaging other than plain x-Cronic abdomen  Assessment and Plan:   ILIA Stark is a 19 y.o. 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 Most likely idiopathic or slow transit secondary to underlying neurologic problem. TSH was normal in the past.   Chronic constipation Continue linaclotide 290 MCG before breakfast GoLYTELY for bowel cleanout as needed  Atypical chest pain, likely secondary to chronic GERD Switched from Prilosec to Protonix 40 mg daily, will continue the same Renal function has been preserved  Follow-up once a year or as needed   36, MD

## 2020-06-22 ENCOUNTER — Encounter: Payer: Self-pay | Admitting: Gastroenterology

## 2020-06-22 NOTE — Telephone Encounter (Signed)
Last office visit 05/25/2020 heart burn  Office visit 04/13/2020 constipation  Last refill 04/13/2020 3 refills to [facility]

## 2020-06-29 ENCOUNTER — Ambulatory Visit: Payer: 59 | Admitting: Cardiovascular Disease

## 2020-08-04 ENCOUNTER — Other Ambulatory Visit: Payer: Self-pay

## 2020-08-04 ENCOUNTER — Other Ambulatory Visit: Payer: 59

## 2020-08-04 DIAGNOSIS — Z20822 Contact with and (suspected) exposure to covid-19: Secondary | ICD-10-CM

## 2020-08-07 LAB — NOVEL CORONAVIRUS, NAA: SARS-CoV-2, NAA: DETECTED — AB

## 2020-09-13 ENCOUNTER — Other Ambulatory Visit: Payer: Self-pay | Admitting: Internal Medicine

## 2020-09-13 ENCOUNTER — Telehealth: Payer: Self-pay | Admitting: Internal Medicine

## 2020-09-13 NOTE — Telephone Encounter (Signed)
Filled by another provider ok to fill?

## 2020-09-13 NOTE — Telephone Encounter (Signed)
Can you fill this for patient?   Metoprolol

## 2020-09-14 ENCOUNTER — Other Ambulatory Visit: Payer: Self-pay | Admitting: Internal Medicine

## 2020-09-16 NOTE — Telephone Encounter (Signed)
Pt has follow up scheduled with Dr. Mariah Milling 09/21/20.  Not authorized to refill at this time per JV.

## 2020-09-21 ENCOUNTER — Ambulatory Visit: Payer: 59 | Admitting: Cardiovascular Disease

## 2020-10-26 ENCOUNTER — Other Ambulatory Visit: Payer: Self-pay

## 2020-11-29 MED FILL — Linaclotide Cap 290 MCG: ORAL | 90 days supply | Qty: 90 | Fill #0 | Status: CN

## 2020-11-30 ENCOUNTER — Other Ambulatory Visit: Payer: Self-pay

## 2020-12-10 ENCOUNTER — Other Ambulatory Visit: Payer: Self-pay

## 2020-12-17 ENCOUNTER — Other Ambulatory Visit: Payer: Self-pay

## 2020-12-17 MED FILL — Linaclotide Cap 290 MCG: ORAL | 90 days supply | Qty: 90 | Fill #0 | Status: AC

## 2021-01-19 ENCOUNTER — Other Ambulatory Visit: Payer: Self-pay | Admitting: Internal Medicine

## 2021-01-20 ENCOUNTER — Other Ambulatory Visit: Payer: Self-pay

## 2021-01-20 MED ORDER — METOPROLOL SUCCINATE ER 50 MG PO TB24
50.0000 mg | ORAL_TABLET | Freq: Every day | ORAL | 0 refills | Status: DC
  Filled 2021-01-20: qty 90, 90d supply, fill #0

## 2021-01-21 ENCOUNTER — Other Ambulatory Visit: Payer: Self-pay

## 2021-01-21 ENCOUNTER — Encounter: Payer: Self-pay | Admitting: Cardiovascular Disease

## 2021-01-21 NOTE — Telephone Encounter (Signed)
Error

## 2021-02-21 ENCOUNTER — Emergency Department: Payer: 59

## 2021-02-21 DIAGNOSIS — Z982 Presence of cerebrospinal fluid drainage device: Secondary | ICD-10-CM | POA: Diagnosis not present

## 2021-02-21 DIAGNOSIS — Z9889 Other specified postprocedural states: Secondary | ICD-10-CM | POA: Diagnosis not present

## 2021-02-21 DIAGNOSIS — R519 Headache, unspecified: Secondary | ICD-10-CM | POA: Insufficient documentation

## 2021-02-21 DIAGNOSIS — Z5321 Procedure and treatment not carried out due to patient leaving prior to being seen by health care provider: Secondary | ICD-10-CM | POA: Diagnosis not present

## 2021-02-21 LAB — POC URINE PREG, ED: Preg Test, Ur: NEGATIVE

## 2021-02-21 NOTE — ED Triage Notes (Signed)
Patient presents to ER from home. Patient reports right sided head pressure. Patient reports she is 1 year post op from brain surgery and shunt revision. Patient reports having migraine last week which resolved. Patient A&Ox3.

## 2021-02-22 ENCOUNTER — Emergency Department
Admission: EM | Admit: 2021-02-22 | Discharge: 2021-02-22 | Disposition: A | Discharge status: Home / Self Care | Payer: 59 | Attending: Emergency Medicine | Admitting: Emergency Medicine

## 2021-02-22 DIAGNOSIS — Z982 Presence of cerebrospinal fluid drainage device: Secondary | ICD-10-CM | POA: Diagnosis not present

## 2021-02-22 DIAGNOSIS — G43009 Migraine without aura, not intractable, without status migrainosus: Secondary | ICD-10-CM | POA: Diagnosis not present

## 2021-02-22 DIAGNOSIS — R519 Headache, unspecified: Secondary | ICD-10-CM

## 2021-04-28 DIAGNOSIS — G919 Hydrocephalus, unspecified: Secondary | ICD-10-CM | POA: Diagnosis not present

## 2021-05-03 ENCOUNTER — Other Ambulatory Visit: Payer: Self-pay | Admitting: Internal Medicine

## 2021-05-03 ENCOUNTER — Other Ambulatory Visit: Payer: Self-pay | Admitting: Gastroenterology

## 2021-05-04 ENCOUNTER — Other Ambulatory Visit: Payer: Self-pay

## 2021-05-04 MED ORDER — METOPROLOL SUCCINATE ER 50 MG PO TB24
50.0000 mg | ORAL_TABLET | Freq: Every day | ORAL | 0 refills | Status: DC
  Filled 2021-05-04: qty 90, 90d supply, fill #0

## 2021-05-18 ENCOUNTER — Other Ambulatory Visit: Payer: Self-pay

## 2021-09-01 ENCOUNTER — Other Ambulatory Visit: Payer: Self-pay

## 2021-09-01 ENCOUNTER — Other Ambulatory Visit (HOSPITAL_COMMUNITY)
Admission: RE | Admit: 2021-09-01 | Discharge: 2021-09-01 | Disposition: A | Discharge status: Home / Self Care | Payer: Medicaid Other | Source: Ambulatory Visit | Attending: Internal Medicine | Admitting: Internal Medicine

## 2021-09-01 ENCOUNTER — Encounter: Payer: Self-pay | Admitting: Internal Medicine

## 2021-09-01 ENCOUNTER — Ambulatory Visit (INDEPENDENT_AMBULATORY_CARE_PROVIDER_SITE_OTHER): Payer: Medicaid Other | Admitting: Internal Medicine

## 2021-09-01 VITALS — BP 110/68 | HR 107 | Temp 98.6°F | Ht <= 58 in | Wt 131.2 lb

## 2021-09-01 DIAGNOSIS — Z13220 Encounter for screening for lipoid disorders: Secondary | ICD-10-CM | POA: Diagnosis not present

## 2021-09-01 DIAGNOSIS — Z23 Encounter for immunization: Secondary | ICD-10-CM

## 2021-09-01 DIAGNOSIS — Z0184 Encounter for antibody response examination: Secondary | ICD-10-CM | POA: Diagnosis not present

## 2021-09-01 DIAGNOSIS — Z124 Encounter for screening for malignant neoplasm of cervix: Secondary | ICD-10-CM | POA: Diagnosis not present

## 2021-09-01 DIAGNOSIS — E559 Vitamin D deficiency, unspecified: Secondary | ICD-10-CM | POA: Diagnosis not present

## 2021-09-01 DIAGNOSIS — Z13818 Encounter for screening for other digestive system disorders: Secondary | ICD-10-CM

## 2021-09-01 DIAGNOSIS — Z0000 Encounter for general adult medical examination without abnormal findings: Secondary | ICD-10-CM | POA: Diagnosis not present

## 2021-09-01 DIAGNOSIS — Z1329 Encounter for screening for other suspected endocrine disorder: Secondary | ICD-10-CM | POA: Diagnosis not present

## 2021-09-01 DIAGNOSIS — Z1389 Encounter for screening for other disorder: Secondary | ICD-10-CM

## 2021-09-01 DIAGNOSIS — Z113 Encounter for screening for infections with a predominantly sexual mode of transmission: Secondary | ICD-10-CM | POA: Diagnosis not present

## 2021-09-01 MED ORDER — TETANUS-DIPHTH-ACELL PERTUSSIS 5-2.5-18.5 LF-MCG/0.5 IM SUSP: 0.5000 mL | Freq: Once | INTRAMUSCULAR | 0 refills | Status: DC

## 2021-09-01 MED ORDER — METOPROLOL SUCCINATE ER 50 MG PO TB24
50.0000 mg | ORAL_TABLET | Freq: Every day | ORAL | 3 refills | Status: DC
  Filled 2021-09-01: qty 90, 90d supply, fill #0
  Filled 2021-09-23: qty 30, 30d supply, fill #0

## 2021-09-01 NOTE — Patient Instructions (Addendum)
Rec total 4 pfizer vaccines -consider 2 more doses  You were given Tdap and flu shots today  Tdap (Tetanus, Diphtheria, Pertussis) Vaccine: What You Need to Know 1. Why get vaccinated? Tdap vaccine can prevent tetanus, diphtheria, and pertussis. Diphtheria and pertussis spread from person to person. Tetanus enters the body through cuts or wounds. TETANUS (T) causes painful stiffening of the muscles. Tetanus can lead to serious health problems, including being unable to open the mouth, having trouble swallowing and breathing, or death. DIPHTHERIA (D) can lead to difficulty breathing, heart failure, paralysis, or death. PERTUSSIS (aP), also known as "whooping cough," can cause uncontrollable, violent coughing that makes it hard to breathe, eat, or drink. Pertussis can be extremely serious especially in babies and young children, causing pneumonia, convulsions, brain damage, or death. In teens and adults, it can cause weight loss, loss of bladder control, passing out, and rib fractures from severe coughing. 2. Tdap vaccine Tdap is only for children 7 years and older, adolescents, and adults.  Adolescents should receive a single dose of Tdap, preferably at age 54 or 12 years. Pregnant people should get a dose of Tdap during every pregnancy, preferably during the early part of the third trimester, to help protect the newborn from pertussis. Infants are most at risk for severe, life-threatening complications from pertussis. Adults who have never received Tdap should get a dose of Tdap. Also, adults should receive a booster dose of either Tdap or Td (a different vaccine that protects against tetanus and diphtheria but not pertussis) every 10 years, or after 5 years in the case of a severe or dirty wound or burn. Tdap may be given at the same time as other vaccines. 3. Talk with your health care provider Tell your vaccine provider if the person getting the vaccine: Has had an allergic reaction after a  previous dose of any vaccine that protects against tetanus, diphtheria, or pertussis, or has any severe, life-threatening allergies Has had a coma, decreased level of consciousness, or prolonged seizures within 7 days after a previous dose of any pertussis vaccine (DTP, DTaP, or Tdap) Has seizures or another nervous system problem Has ever had Guillain-Barr Syndrome (also called "GBS") Has had severe pain or swelling after a previous dose of any vaccine that protects against tetanus or diphtheria In some cases, your health care provider may decide to postpone Tdap vaccination until a future visit. People with minor illnesses, such as a cold, may be vaccinated. People who are moderately or severely ill should usually wait until they recover before getting Tdap vaccine.  Your health care provider can give you more information. 4. Risks of a vaccine reaction Pain, redness, or swelling where the shot was given, mild fever, headache, feeling tired, and nausea, vomiting, diarrhea, or stomachache sometimes happen after Tdap vaccination. People sometimes faint after medical procedures, including vaccination. Tell your provider if you feel dizzy or have vision changes or ringing in the ears.  As with any medicine, there is a very remote chance of a vaccine causing a severe allergic reaction, other serious injury, or death. 5. What if there is a serious problem? An allergic reaction could occur after the vaccinated person leaves the clinic. If you see signs of a severe allergic reaction (hives, swelling of the face and throat, difficulty breathing, a fast heartbeat, dizziness, or weakness), call 9-1-1 and get the person to the nearest hospital. For other signs that concern you, call your health care provider.  Adverse reactions should be reported  to the Vaccine Adverse Event Reporting System (VAERS). Your health care provider will usually file this report, or you can do it yourself. Visit the VAERS website at  www.vaers.LAgents.no or call 616 813 9483. VAERS is only for reporting reactions, and VAERS staff members do not give medical advice. 6. The National Vaccine Injury Compensation Program The [employer] Vaccine Injury Compensation Program (VICP) is a federal program that was created to compensate people who may have been injured by certain vaccines. Claims regarding alleged injury or death due to vaccination have a time limit for filing, which may be as short as two years. Visit the VICP website at SpiritualWord.at or call 850-008-8391 to learn about the program and about filing a claim. 7. How can I learn more? Ask your health care provider. Call your local or state health department. Visit the website of the Food and Drug Administration (FDA) for vaccine package inserts and additional information at FinderList.no. Contact the Centers for Disease Control and Prevention (CDC): Call 864 723 5141 (1-800-CDC-INFO) or Visit CDC's website at PicCapture.uy. Vaccine Information Statement Tdap (Tetanus, Diphtheria, Pertussis) Vaccine (02/27/2020) This information is not intended to replace advice given to you by your health care provider. Make sure you discuss any questions you have with your health care provider. Document Revised: 03/24/2020 Document Reviewed: 03/24/2020 Elsevier Patient Education  2022 [employer].

## 2021-09-01 NOTE — Progress Notes (Signed)
Chief Complaint  Patient presents with   Form Completion   Annual Exam   Annual  Form completion for transportation filled out today  She is sexually active has nexplanon did pap today    Review of Systems  Constitutional:  Negative for weight loss.  HENT:  Negative for hearing loss.   Eyes:  Negative for blurred vision.  Respiratory:  Negative for shortness of breath.   Cardiovascular:  Negative for chest pain.  Gastrointestinal:  Negative for abdominal pain and blood in stool.  Genitourinary:  Negative for dysuria.  Musculoskeletal:  Negative for falls and joint pain.  Skin:  Negative for rash.  Neurological:  Negative for headaches.  Psychiatric/Behavioral:  Negative for depression.   Past Medical History:  Diagnosis Date   Blind    since age 59 y.o legally   Central sleep apnea    Constipation    GERD (gastroesophageal reflux disease)    Hydrocephalus (HCC)    Hypertension    Migraine    Open-angle glaucoma    Optic atrophy, both eyes    Preterm delivery    25 weeks   Seizures ([place])    Past Surgical History:  Procedure Laterality Date   BRAIN SURGERY     And shunt placement   COLONOSCOPY WITH PROPOFOL N/A 01/02/2020   Procedure: COLONOSCOPY WITH PROPOFOL;  Surgeon: Lin Landsman, MD;  Location: [facility] ENDOSCOPY;  Service: Gastroenterology;  Laterality: N/A;   CRAINOTOMY Left 01/2012   for dural biopsy   ESOPHAGOGASTRODUODENOSCOPY (EGD) WITH PROPOFOL N/A 01/02/2020   Procedure: ESOPHAGOGASTRODUODENOSCOPY (EGD) WITH PROPOFOL;  Surgeon: Lin Landsman, MD;  Location: [place];  Service: Gastroenterology;  Laterality: N/A;   EXCISION OF SUBGLOTIC CYST  01/1999   FRONTAL BOLT PLACEMENT Left 02/2010   SUB-GALEAL VENTRICULAR SHUNT  09/1998   VENTRICULOPERITONEAL SHUNT Right 10/1998   VENTRICULOPERITONEAL SHUNT  12/2005, 03/2006, 09/2009   REVISIONS   VENTRICULOPERITONEAL SHUNT     revision 01/2020 Dr. Cari Caraway now as of 09/01/21 no h/a   Family History   Problem Relation Age of Onset   Heart attack Father    Heart disease Father    Social History   Socioeconomic History   Marital status: Single    Spouse name: Not on file   Number of children: Not on file   Years of education: Not on file   Highest education level: Not on file  Occupational History   Not on file  Tobacco Use   Smoking status: Never   Smokeless tobacco: Never  Vaping Use   Vaping Use: Never used  Substance and Sexual Activity   Alcohol use: Yes    Comment: wine-occassionally   Drug use: No   Sexual activity: Never  Other Topics Concern   Not on file  Social History Narrative   [employer].    Senior year   Likes to [employer] home on weekends   Social Determinants of Health   Financial Resource Strain: Not on file  Food Insecurity: Not on file  Transportation Needs: Not on file  Physical Activity: Not on file  Stress: Not on file  Social Connections: Not on file  Intimate Partner Violence: Not on file   Current Meds  Medication Sig   etonogestrel (NEXPLANON) 68 MG IMPL implant 68 mg.   [DISCONTINUED] metoprolol succinate (TOPROL-XL) 50 MG 24 hr tablet Take 1 tablet (50 mg total) by mouth at bedtime.   [DISCONTINUED] Tdap ([place])  5-2.5-18.5 LF-MCG/0.5 injection Inject 0.5 mLs into the muscle once for 1 dose.   Allergies  Allergen Reactions   Ativan [Lorazepam] Other (See Comments)    combative   Recent Results (from the past 2160 hour(s))  Comprehensive metabolic panel     Status: None   Collection Time: 09/01/21  4:17 PM  Result Value Ref Range   Sodium 139 135 - 145 mEq/L   Potassium 3.8 3.5 - 5.1 mEq/L   Chloride 102 96 - 112 mEq/L   CO2 23 19 - 32 mEq/L   Glucose, Bld 80 70 - 99 mg/dL   BUN 14 6 - 23 mg/dL   Creatinine, Ser 0.77 0.40 - 1.20 mg/dL   Total Bilirubin 0.5 0.2 - 1.2 mg/dL   Alkaline Phosphatase 85 39 - 117 U/L   AST 15 0 - 37 U/L   ALT 19 0 - 35 U/L   Total Protein 7.9 6.0 - 8.3 g/dL    Albumin 4.5 3.5 - 5.2 g/dL   GFR 109.05 >60.00 mL/min    Comment: Calculated using the CKD-EPI Creatinine Equation (2021)   Calcium 9.7 8.4 - 10.5 mg/dL  Lipid panel     Status: Abnormal   Collection Time: 09/01/21  4:17 PM  Result Value Ref Range   Cholesterol 202 (H) 0 - 200 mg/dL    Comment: ATP III Classification       Desirable:  < 200 mg/dL               Borderline High:  200 - 239 mg/dL          High:  > = 240 mg/dL   Triglycerides 107.0 0.0 - 149.0 mg/dL    Comment: Normal:  <150 mg/dLBorderline High:  150 - 199 mg/dL   HDL 44.30 >39.00 mg/dL   VLDL 21.4 0.0 - 40.0 mg/dL   LDL Cholesterol 136 (H) 0 - 99 mg/dL   Total CHOL/HDL Ratio 5     Comment:                Men          Women1/2 Average Risk     3.4          3.3Average Risk          5.0          4.42X Average Risk          9.6          7.13X Average Risk          15.0          11.0                       NonHDL 157.65     Comment: NOTE:  Non-HDL goal should be 30 mg/dL higher than patient's LDL goal (i.e. LDL goal of < 70 mg/dL, would have non-HDL goal of < 100 mg/dL)  CBC with Differential/Platelet     Status: Abnormal   Collection Time: 09/01/21  4:17 PM  Result Value Ref Range   WBC 12.0 (H) 4.0 - 10.5 K/uL   RBC 4.29 3.87 - 5.11 Mil/uL   Hemoglobin 12.4 12.0 - 15.0 g/dL   HCT 38.2 36.0 - 46.0 %   MCV 89.1 78.0 - 100.0 fl   MCHC 32.6 30.0 - 36.0 g/dL   RDW 13.0 11.5 - 15.5 %   Platelets 363.0 150.0 - 400.0 K/uL   Neutrophils Relative % 60.8 43.0 -  77.0 %   Lymphocytes Relative 30.7 12.0 - 46.0 %   Monocytes Relative 6.5 3.0 - 12.0 %   Eosinophils Relative 1.3 0.0 - 5.0 %   Basophils Relative 0.7 0.0 - 3.0 %   Neutro Abs 7.3 1.4 - 7.7 K/uL   Lymphs Abs 3.7 0.7 - 4.0 K/uL   Monocytes Absolute 0.8 0.1 - 1.0 K/uL   Eosinophils Absolute 0.2 0.0 - 0.7 K/uL   Basophils Absolute 0.1 0.0 - 0.1 K/uL  TSH     Status: None   Collection Time: 09/01/21  4:17 PM  Result Value Ref Range   TSH 0.79 0.35 - 5.50 uIU/mL  Vitamin  D (25 hydroxy)     Status: Abnormal   Collection Time: 09/01/21  4:17 PM  Result Value Ref Range   VITD 15.95 (L) 30.00 - 100.00 ng/mL  RPR     Status: None   Collection Time: 09/01/21  4:17 PM  Result Value Ref Range   RPR Ser Ql NON-REACTIVE NON-REACTIVE  HSV(herpes simplex vrs) 1+2 ab-IgG     Status: Abnormal   Collection Time: 09/01/21  4:17 PM  Result Value Ref Range   HAV 1 IGG,TYPE SPECIFIC AB 33.40 (H) index   HSV 2 IGG,TYPE SPECIFIC AB <0.90 index    Comment:                           Index          Interpretation                           -----          --------------                           <0.90          Negative                           0.90-1.09      Equivocal                           >1.09          Positive . This assay utilizes recombinant type-specific antigens to differentiate HSV-1 from HSV-2 infections. A positive result cannot distinguish between recent and past infection. If recent HSV infection is suspected but the results are negative or equivocal, the assay should be repeated in 4-6 weeks. The performance characteristics of the assay have not been established for pediatric populations, immunocompromised patients, or neonatal screening. . . For additional information, please refer to  http://education.[facility].com/faq/FAQ118  (This link is being provided for informational/ educational purposes only.) .    Objective  Body mass index is 28.39 kg/m. Wt Readings from Last 3 Encounters:  09/01/21 131 lb 3.2 oz (59.5 kg)  02/21/21 121 lb 4.1 oz (55 kg)  05/25/20 122 lb 6 oz (55.5 kg)   Temp Readings from Last 3 Encounters:  09/01/21 98.6 F (37 C) (Oral)  02/21/21 98.9 F (37.2 C)  05/25/20 98.6 F (37 C) (Oral)   BP Readings from Last 3 Encounters:  09/01/21 110/68  02/21/21 129/76  05/25/20 (!) 131/92   Pulse Readings from Last 3 Encounters:  09/01/21 (!) 107  02/21/21 89  05/25/20 100    Physical Exam Vitals and nursing  note reviewed.  Constitutional:      Appearance: Normal appearance. She is well-developed and well-groomed.  HENT:     Head: Normocephalic and atraumatic.  Eyes:     Conjunctiva/sclera: Conjunctivae normal.     Pupils: Pupils are equal, round, and reactive to light.  Cardiovascular:     Rate and Rhythm: Regular rhythm. Tachycardia present.     Heart sounds: Normal heart sounds. No murmur heard. Pulmonary:     Effort: Pulmonary effort is normal.     Breath sounds: Normal breath sounds.  Chest:  Breasts:    Right: Normal.     Left: Normal.  Abdominal:     General: Abdomen is flat. Bowel sounds are normal.     Tenderness: There is no abdominal tenderness.  Genitourinary:    Pubic Area: No rash.      Labia:        Right: No rash.        Left: No rash.      Vagina: Normal.     Cervix: Discharge present.     Uterus: Normal.      Adnexa: Right adnexa normal and left adnexa normal.  Musculoskeletal:        General: No tenderness.  Lymphadenopathy:     Upper Body:     Right upper body: No axillary adenopathy.     Left upper body: No axillary adenopathy.  Skin:    General: Skin is warm and dry.  Neurological:     General: No focal deficit present.     Mental Status: She is alert and oriented to person, place, and time. Mental status is at baseline.     Cranial Nerves: Cranial nerves 2-12 are intact.     Motor: Motor function is intact.     Coordination: Coordination is intact.     Gait: Gait is intact.  Psychiatric:        Attention and Perception: Attention and perception normal.        Mood and Affect: Mood and affect normal.        Speech: Speech normal.        Behavior: Behavior normal. Behavior is cooperative.        Thought Content: Thought content normal.        Cognition and Memory: Cognition and memory normal.        Judgment: Judgment normal.    Assessment  Plan  Annual physical exam - Plan: labs today pt is not fasting  See below  Routine cervical smear -  Plan: Cytology - PAP( [place])   Vitamin D deficiency - Plan: Vitamin D (25 hydroxy)   Flu, Tdap shots given today  utd covid vaccine 2/2 disc consider 4  Tdap had 12/15/09 will do at f/u or RN visit Check hep B titer in the future with labs prevnar utd  HPV utd  MCV utd  MMRV utd  Pap today  Breast exam today   rec healthy diet and exercise    Chronic constipation - Plan: DG Abd 1 View Generalized abdominal pain - Plan: DG Abd 1 View F/u Dr. 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 Daun Peacock with h/o chronic migraine as of 09/01/21 no h/as resolved- Plan:  Ambulatory referral to Neurosurgery Dr. Cari Caraway prn F/u Dr. Melrose Nakayama      Specialists:  Neuro:Dr. Melrose Nakayama  Ob/gyn: Dr. Leafy Ro GI Dr.  [place] NS Dr. Daun Peacock  Eye Dr. Thomasene Ripple  Provider: Dr. Olivia Mackie McLean-Scocuzza-Internal Medicine

## 2021-09-02 ENCOUNTER — Other Ambulatory Visit: Payer: Self-pay

## 2021-09-02 ENCOUNTER — Ambulatory Visit: Payer: 59 | Admitting: Internal Medicine

## 2021-09-02 ENCOUNTER — Other Ambulatory Visit: Payer: Self-pay | Admitting: Internal Medicine

## 2021-09-02 DIAGNOSIS — Z0000 Encounter for general adult medical examination without abnormal findings: Secondary | ICD-10-CM | POA: Diagnosis not present

## 2021-09-02 DIAGNOSIS — Z13818 Encounter for screening for other digestive system disorders: Secondary | ICD-10-CM | POA: Diagnosis not present

## 2021-09-02 DIAGNOSIS — E559 Vitamin D deficiency, unspecified: Secondary | ICD-10-CM | POA: Diagnosis not present

## 2021-09-02 DIAGNOSIS — Z1389 Encounter for screening for other disorder: Secondary | ICD-10-CM | POA: Diagnosis not present

## 2021-09-02 DIAGNOSIS — Z113 Encounter for screening for infections with a predominantly sexual mode of transmission: Secondary | ICD-10-CM | POA: Diagnosis not present

## 2021-09-02 DIAGNOSIS — Z13220 Encounter for screening for lipoid disorders: Secondary | ICD-10-CM | POA: Diagnosis not present

## 2021-09-02 DIAGNOSIS — Z124 Encounter for screening for malignant neoplasm of cervix: Secondary | ICD-10-CM | POA: Diagnosis not present

## 2021-09-02 DIAGNOSIS — Z0184 Encounter for antibody response examination: Secondary | ICD-10-CM | POA: Diagnosis not present

## 2021-09-02 DIAGNOSIS — Z23 Encounter for immunization: Secondary | ICD-10-CM

## 2021-09-02 DIAGNOSIS — Z1329 Encounter for screening for other suspected endocrine disorder: Secondary | ICD-10-CM | POA: Diagnosis not present

## 2021-09-02 LAB — CBC WITH DIFFERENTIAL/PLATELET
Basophils Absolute: 0.1 K/uL (ref 0.0–0.1)
Basophils Relative: 0.7 % (ref 0.0–3.0)
Eosinophils Absolute: 0.2 K/uL (ref 0.0–0.7)
Eosinophils Relative: 1.3 % (ref 0.0–5.0)
HCT: 38.2 % (ref 36.0–46.0)
Hemoglobin: 12.4 g/dL (ref 12.0–15.0)
Lymphocytes Relative: 30.7 % (ref 12.0–46.0)
Lymphs Abs: 3.7 K/uL (ref 0.7–4.0)
MCHC: 32.6 g/dL (ref 30.0–36.0)
MCV: 89.1 fl (ref 78.0–100.0)
Monocytes Absolute: 0.8 K/uL (ref 0.1–1.0)
Monocytes Relative: 6.5 % (ref 3.0–12.0)
Neutro Abs: 7.3 K/uL (ref 1.4–7.7)
Neutrophils Relative %: 60.8 % (ref 43.0–77.0)
Platelets: 363 K/uL (ref 150.0–400.0)
RBC: 4.29 Mil/uL (ref 3.87–5.11)
RDW: 13 % (ref 11.5–15.5)
WBC: 12 K/uL — ABNORMAL HIGH (ref 4.0–10.5)

## 2021-09-02 LAB — COMPREHENSIVE METABOLIC PANEL WITH GFR
ALT: 19 U/L (ref 0–35)
AST: 15 U/L (ref 0–37)
Albumin: 4.5 g/dL (ref 3.5–5.2)
Alkaline Phosphatase: 85 U/L (ref 39–117)
BUN: 14 mg/dL (ref 6–23)
CO2: 23 meq/L (ref 19–32)
Calcium: 9.7 mg/dL (ref 8.4–10.5)
Chloride: 102 meq/L (ref 96–112)
Creatinine, Ser: 0.77 mg/dL (ref 0.40–1.20)
GFR: 109.05 mL/min (ref >60.00)
Glucose, Bld: 80 mg/dL (ref 70–99)
Potassium: 3.8 meq/L (ref 3.5–5.1)
Sodium: 139 meq/L (ref 135–145)
Total Bilirubin: 0.5 mg/dL (ref 0.2–1.2)
Total Protein: 7.9 g/dL (ref 6.0–8.3)

## 2021-09-02 LAB — LIPID PANEL
Cholesterol: 202 mg/dL — ABNORMAL HIGH (ref 0–200)
HDL: 44.3 mg/dL (ref >39.00)
LDL Cholesterol: 136 mg/dL — ABNORMAL HIGH (ref 0–99)
NonHDL: 157.65
Total CHOL/HDL Ratio: 5
Triglycerides: 107 mg/dL (ref 0.0–149.0)
VLDL: 21.4 mg/dL (ref 0.0–40.0)

## 2021-09-02 LAB — HEPATITIS B SURFACE ANTIBODY, QUANTITATIVE: Hep B S AB Quant (Post): 125 m[IU]/mL (ref >=10)

## 2021-09-02 LAB — TSH: TSH: 0.79 u[IU]/mL (ref 0.35–5.50)

## 2021-09-02 LAB — HEPATITIS C ANTIBODY
Hepatitis C Ab: NONREACTIVE
SIGNAL TO CUT-OFF: 0.08 (ref <1.00)

## 2021-09-02 LAB — VITAMIN D 25 HYDROXY (VIT D DEFICIENCY, FRACTURES): VITD: 15.95 ng/mL — ABNORMAL LOW (ref 30.00–100.00)

## 2021-09-02 LAB — HIV ANTIBODY (ROUTINE TESTING W REFLEX): HIV 1&2 Ab, 4th Generation: NONREACTIVE

## 2021-09-02 LAB — HSV(HERPES SIMPLEX VRS) I + II AB-IGG
HAV 1 IGG,TYPE SPECIFIC AB: 33.4 index — ABNORMAL HIGH
HSV 2 IGG,TYPE SPECIFIC AB: <0.9 index

## 2021-09-02 LAB — RPR: RPR Ser Ql: NONREACTIVE

## 2021-09-02 MED ORDER — CHOLECALCIFEROL 1.25 MG (50000 UT) PO CAPS
50000.0000 [IU] | ORAL_CAPSULE | ORAL | 1 refills | Status: DC
  Filled 2021-09-02: qty 4, 28d supply, fill #0
  Filled 2021-09-23: qty 13, 91d supply, fill #0

## 2021-09-04 ENCOUNTER — Other Ambulatory Visit: Payer: Self-pay | Admitting: Gastroenterology

## 2021-09-06 LAB — CYTOLOGY - PAP
Chlamydia: NEGATIVE
Comment: NEGATIVE
Comment: NEGATIVE
Comment: NEGATIVE
Comment: NORMAL
Diagnosis: UNDETERMINED — AB
High risk HPV: POSITIVE — AB
Neisseria Gonorrhea: NEGATIVE
Trichomonas: NEGATIVE

## 2021-09-07 ENCOUNTER — Other Ambulatory Visit: Payer: Self-pay | Admitting: Internal Medicine

## 2021-09-07 ENCOUNTER — Encounter: Payer: Self-pay | Admitting: Internal Medicine

## 2021-09-07 ENCOUNTER — Other Ambulatory Visit: Payer: Self-pay

## 2021-09-07 ENCOUNTER — Other Ambulatory Visit (HOSPITAL_COMMUNITY): Payer: Self-pay

## 2021-09-07 DIAGNOSIS — B3731 Acute candidiasis of vulva and vagina: Secondary | ICD-10-CM

## 2021-09-07 DIAGNOSIS — B977 Papillomavirus as the cause of diseases classified elsewhere: Secondary | ICD-10-CM | POA: Insufficient documentation

## 2021-09-07 MED ORDER — FLUCONAZOLE 150 MG PO TABS
150.0000 mg | ORAL_TABLET | Freq: Once | ORAL | 0 refills | Status: AC
  Filled 2021-09-07 – 2021-09-23 (×2): qty 1, 1d supply, fill #0

## 2021-09-08 ENCOUNTER — Other Ambulatory Visit: Payer: Self-pay

## 2021-09-12 ENCOUNTER — Other Ambulatory Visit: Payer: Self-pay

## 2021-09-21 ENCOUNTER — Encounter: Payer: Self-pay | Admitting: Internal Medicine

## 2021-09-23 ENCOUNTER — Other Ambulatory Visit: Payer: Self-pay

## 2021-11-18 ENCOUNTER — Other Ambulatory Visit: Payer: Self-pay | Admitting: Internal Medicine

## 2021-11-18 ENCOUNTER — Telehealth: Payer: Self-pay

## 2021-11-18 DIAGNOSIS — Z0184 Encounter for antibody response examination: Secondary | ICD-10-CM

## 2021-11-18 DIAGNOSIS — Z13818 Encounter for screening for other digestive system disorders: Secondary | ICD-10-CM

## 2021-11-18 DIAGNOSIS — Z111 Encounter for screening for respiratory tuberculosis: Secondary | ICD-10-CM

## 2021-11-18 NOTE — Telephone Encounter (Signed)
Patient's mother dropped off papers for school that need to be completed by provider.  Paperwork is up front in Dr. Alford Highland color folder. ?

## 2021-11-18 NOTE — Telephone Encounter (Signed)
Please scan all forms  ?Pt will need labs hep A, TB lab, MMR, chickpox  ?-->please schedule and once resulted pt needs copy of labs to go with forms  ?Pull all vaccines from [facility] as well to go with forms  ?Forms all filled out by MD otherwise  ? ? ? ?Thank you ? ?

## 2021-11-21 NOTE — Telephone Encounter (Signed)
Lvm to inform pt that forms have been completed by MD, pt is in need of labs and once resulted, she will need copy of labs to go along with forms. Informed pt to call office to schedule lab appt at her convenience at [phone]. Lab orders have been placed.  ?

## 2021-11-22 NOTE — Telephone Encounter (Signed)
S/w pt's mom and she stated that pt has upcoming OV 11/25/21 with PCP and will have labs done then and P/U forms at the same time.  ?Informed her that lab results may all not be back same day, but once they do she will need copy of labs printed and attached to forms. She can print lab results from [facility]. Mom verbalized understanding.  ? ?All forms have been copied and sent to scan 11/21/21 ?Vaccines from [facility] were attached as well.  ?

## 2021-11-25 ENCOUNTER — Ambulatory Visit (INDEPENDENT_AMBULATORY_CARE_PROVIDER_SITE_OTHER): Payer: Managed Care, Other (non HMO) | Admitting: Internal Medicine

## 2021-11-25 ENCOUNTER — Encounter: Payer: Self-pay | Admitting: Internal Medicine

## 2021-11-25 VITALS — BP 122/84 | HR 104 | Temp 99.0°F | Resp 14 | Ht <= 58 in | Wt 130.6 lb

## 2021-11-25 DIAGNOSIS — K581 Irritable bowel syndrome with constipation: Secondary | ICD-10-CM

## 2021-11-25 DIAGNOSIS — K219 Gastro-esophageal reflux disease without esophagitis: Secondary | ICD-10-CM | POA: Diagnosis not present

## 2021-11-25 DIAGNOSIS — Z111 Encounter for screening for respiratory tuberculosis: Secondary | ICD-10-CM

## 2021-11-25 DIAGNOSIS — Z0184 Encounter for antibody response examination: Secondary | ICD-10-CM | POA: Diagnosis not present

## 2021-11-25 DIAGNOSIS — E559 Vitamin D deficiency, unspecified: Secondary | ICD-10-CM | POA: Diagnosis not present

## 2021-11-25 DIAGNOSIS — R000 Tachycardia, unspecified: Secondary | ICD-10-CM

## 2021-11-25 DIAGNOSIS — Z13818 Encounter for screening for other digestive system disorders: Secondary | ICD-10-CM | POA: Diagnosis not present

## 2021-11-25 MED ORDER — METOPROLOL SUCCINATE ER 50 MG PO TB24: 50.0000 mg | ORAL_TABLET | Freq: Every day | ORAL | 3 refills | Status: DC

## 2021-11-25 MED ORDER — PANTOPRAZOLE SODIUM 40 MG PO TBEC: DELAYED_RELEASE_TABLET | Freq: Every day | ORAL | 1 refills | Status: DC

## 2021-11-25 MED ORDER — CHOLECALCIFEROL 1.25 MG (50000 UT) PO CAPS: 50000.0000 [IU] | ORAL_CAPSULE | ORAL | 1 refills | Status: DC

## 2021-11-25 MED ORDER — LINACLOTIDE 290 MCG PO CAPS: ORAL_CAPSULE | ORAL | 3 refills | Status: AC

## 2021-11-25 NOTE — Progress Notes (Signed)
Chief Complaint  ?Patient presents with  ? Follow-up  ?  3 mon, denies any concerns or pain. Labs today  ? ?F/u with mom  ?1. Paperwork for [employer] to be in [employer] degree forms filled out and given to mom needs labs for school as well  ? ?Review of Systems  ?Constitutional:  Negative for weight loss.  ?HENT:  Negative for hearing loss.   ?Eyes:  Negative for blurred vision.  ?Respiratory:  Negative for shortness of breath.   ?Cardiovascular:  Negative for chest pain.  ?Gastrointestinal:  Negative for abdominal pain and blood in stool.  ?Genitourinary:  Negative for dysuria.  ?Musculoskeletal:  Negative for falls and joint pain.  ?Skin:  Negative for rash.  ?Neurological:  Negative for headaches.  ?Psychiatric/Behavioral:  Negative for depression.   ?Past Medical History:  ?Diagnosis Date  ? Blind   ? since age 21 y.o legally  ? Central sleep apnea   ? Constipation   ? GERD (gastroesophageal reflux disease)   ? HPV in female   ? 09/01/21 pap ASCUS HPV+  ? Hydrocephalus (HCC)   ? Hypertension   ? Migraine   ? Open-angle glaucoma   ? Optic atrophy, both eyes   ? Preterm delivery   ? 25 weeks  ? Seizures (HCC)   ? ?Past Surgical History:  ?Procedure Laterality Date  ? BRAIN SURGERY    ? And shunt placement  ? COLONOSCOPY WITH PROPOFOL N/A 01/02/2020  ? Procedure: COLONOSCOPY WITH PROPOFOL;  Surgeon: Toney ReilVanga, Rohini Reddy, MD;  [facility];  Service: Gastroenterology;  Laterality: N/A;  ? CRAINOTOMY Left 01/2012  ? for dural biopsy  ? ESOPHAGOGASTRODUODENOSCOPY (EGD) WITH PROPOFOL N/A 01/02/2020  ? Procedure: ESOPHAGOGASTRODUODENOSCOPY (EGD) WITH PROPOFOL;  Surgeon: Toney ReilVanga, Rohini Reddy, MD;  [facility];  Service: Gastroenterology;  Laterality: N/A;  ? EXCISION OF SUBGLOTIC CYST  01/1999  ? FRONTAL BOLT PLACEMENT Left 02/2010  ? SUB-GALEAL VENTRICULAR SHUNT  09/1998  ? VENTRICULOPERITONEAL SHUNT Right 10/1998  ? VENTRICULOPERITONEAL SHUNT  12/2005, 03/2006, 09/2009  ? REVISIONS  ?  VENTRICULOPERITONEAL SHUNT    ? revision 01/2020 Dr. Marcell BarlowYarborough now as of 09/01/21 no h/a  ? ?Family History  ?Problem Relation Age of Onset  ? Heart attack Father   ? Heart disease Father   ? ?Social History  ? ?Socioeconomic History  ? Marital status: Single  ?  Spouse name: Not on file  ? Number of children: Not on file  ? Years of education: Not on file  ? Highest education level: Not on file  ?Occupational History  ? Not on file  ?Tobacco Use  ? Smoking status: Never  ? Smokeless tobacco: Never  ?Vaping Use  ? Vaping Use: Never used  ?Substance and Sexual Activity  ? Alcohol use: Yes  ?  Comment: wine-occassionally  ? Drug use: No  ? Sexual activity: Never  ?Other Topics Concern  ? Not on file  ?Social History Narrative  ? [employer].   ? Senior year  ? Likes to cheer  ?   ? Comes home on weekends  ? ?Social Determinants of Health  ? ?Financial Resource Strain: Not on file  ?Food Insecurity: Not on file  ?Transportation Needs: Not on file  ?Physical Activity: Not on file  ?Stress: Not on file  ?Social Connections: Not on file  ?Intimate Partner Violence: Not on file  ? ?Current Meds  ?Medication Sig  ? etonogestrel (NEXPLANON) 68 MG IMPL implant 68 mg.  ? [  DISCONTINUED] Cholecalciferol 1.25 MG (50000 UT) capsule Take 1 capsule (50,000 Units total) by mouth once a week. D3  ? [DISCONTINUED] linaclotide (LINZESS) 290 MCG CAPS capsule TAKE 1 CAPSULE BY MOUTH DAILY BEFORE BREAKFAST.  ? [DISCONTINUED] metoprolol succinate (TOPROL-XL) 50 MG 24 hr tablet Take 1 tablet (50 mg total) by mouth at bedtime.  ? [DISCONTINUED] omeprazole (PRILOSEC) 40 MG capsule Take 1 capsule (40 mg total) by mouth 2 (two) times daily before a meal.  ? ?Allergies  ?Allergen Reactions  ? Ativan [Lorazepam] Other (See Comments)  ?  combative  ? ?Recent Results (from the past 2160 hour(s))  ?Cytology - PAP( [place])     Status: Abnormal  ? Collection Time: 09/01/21  3:53 PM  ?Result Value Ref Range  ? High risk HPV Positive  (A)   ? Neisseria Gonorrhea Negative   ? Chlamydia Negative   ? Trichomonas Negative   ? Adequacy    ?  Satisfactory for evaluation; transformation zone component PRESENT.  ? Diagnosis (A)   ?  - Atypical squamous cells of undetermined significance (ASC-US)  ? Microorganisms    ?  Fungal organisms present consistent with Candida spp.  ? Comment Normal Reference Range Trichomonas - Negative   ? Comment Normal Reference Range HPV - Negative   ? Comment Normal Reference [place] Chlamydia - Negative   ? Comment    ?  Normal Reference Range Neisseria Gonorrhea - Negative  ?Comprehensive metabolic panel     Status: None  ? Collection Time: 09/01/21  4:17 PM  ?Result Value Ref Range  ? Sodium 139 135 - 145 mEq/L  ? Potassium 3.8 3.5 - 5.1 mEq/L  ? Chloride 102 96 - 112 mEq/L  ? CO2 23 19 - 32 mEq/L  ? Glucose, Bld 80 70 - 99 mg/dL  ? BUN 14 6 - 23 mg/dL  ? Creatinine, Ser 0.77 0.40 - 1.20 mg/dL  ? Total Bilirubin 0.5 0.2 - 1.2 mg/dL  ? Alkaline Phosphatase 85 39 - 117 U/L  ? AST 15 0 - 37 U/L  ? ALT 19 0 - 35 U/L  ? Total Protein 7.9 6.0 - 8.3 g/dL  ? Albumin 4.5 3.5 - 5.2 g/dL  ? GFR 109.05 >60.00 mL/min  ?  Comment: Calculated using the CKD-EPI Creatinine Equation (2021)  ? Calcium 9.7 8.4 - 10.5 mg/dL  ?Lipid panel     Status: Abnormal  ? Collection Time: 09/01/21  4:17 PM  ?Result Value Ref Range  ? Cholesterol 202 (H) 0 - 200 mg/dL  ?  Comment: ATP III Classification       Desirable:  < 200 mg/dL               Borderline High:  200 - 239 mg/dL          High:  > = 094 mg/dL  ? Triglycerides 107.0 0.0 - 149.0 mg/dL  ?  Comment: Normal:  <150 mg/dLBorderline High:  150 - 199 mg/dL  ? HDL 44.30 >39.00 mg/dL  ? VLDL 21.4 0.0 - 40.0 mg/dL  ? LDL Cholesterol 136 (H) 0 - 99 mg/dL  ? Total CHOL/HDL Ratio 5   ?  Comment:                Men          Women1/2 Average Risk     3.4          3.3Average Risk          5.0  4.42X Average Risk          9.6          7.13X Average Risk          15.0          11.0                       ? NonHDL 157.65   ?  Comment: NOTE:  Non-HDL goal should be 30 mg/dL higher than patient's LDL goal (i.e. LDL goal of < 70 mg/dL, would have non-HDL goal of < 100 mg/dL)  ?CBC with Differential/Platelet     Status: Abnormal  ? Collection Time: 09/01/21  4:17 PM  ?Result Value Ref Range  ? WBC 12.0 (H) 4.0 - 10.5 K/uL  ? RBC 4.29 3.87 - 5.11 Mil/uL  ? Hemoglobin 12.4 12.0 - 15.0 g/dL  ? HCT 38.2 36.0 - 46.0 %  ? MCV 89.1 78.0 - 100.0 fl  ? MCHC 32.6 30.0 - 36.0 g/dL  ? RDW 13.0 11.5 - 15.5 %  ? Platelets 363.0 150.0 - 400.0 K/uL  ? Neutrophils Relative % 60.8 43.0 - 77.0 %  ? Lymphocytes Relative 30.7 12.0 - 46.0 %  ? Monocytes Relative 6.5 3.0 - 12.0 %  ? Eosinophils Relative 1.3 0.0 - 5.0 %  ? Basophils Relative 0.7 0.0 - 3.0 %  ? Neutro Abs 7.3 1.4 - 7.7 K/uL  ? Lymphs Abs 3.7 0.7 - 4.0 K/uL  ? Monocytes Absolute 0.8 0.1 - 1.0 K/uL  ? Eosinophils Absolute 0.2 0.0 - 0.7 K/uL  ? Basophils Absolute 0.1 0.0 - 0.1 K/uL  ?TSH     Status: None  ? Collection Time: 09/01/21  4:17 PM  ?Result Value Ref Range  ? TSH 0.79 0.35 - 5.50 uIU/mL  ?Vitamin D (25 hydroxy)     Status: Abnormal  ? Collection Time: 09/01/21  4:17 PM  ?Result Value Ref Range  ? VITD 15.95 (L) 30.00 - 100.00 ng/mL  ?HIV antibody (with reflex)     Status: None  ? Collection Time: 09/01/21  4:17 PM  ?Result Value Ref Range  ? HIV 1&2 Ab, 4th Generation NON-REACTIVE NON-REACTIVE  ?  Comment: HIV-1 antigen and HIV-1/HIV-2 antibodies were not ?detected. There is no laboratory evidence of HIV ?infection. ?. ?PLEASE NOTE: This information has been disclosed to ?you from records whose confidentiality may be ?protected by state law.  If your state requires such ?protection, then the state law prohibits you from ?making any further disclosure of the information ?without the specific written consent of the person ?to whom it pertains, or as otherwise permitted by law. ?A general authorization for the release of medical or ?other information is NOT sufficient for  this purpose. ?. ?For additional information please refer to ?http://education.[facility].com/faq/FAQ106 ?(This link is being provided for informational/ ?educational purposes only.) ?. ?. ?The performance

## 2021-11-25 NOTE — Patient Instructions (Addendum)
Remove/Replace Nexplanon   ?  ?Procedures   ?PR ETONOGESTREL IMPLANT SYSTEM   ?PR RMVL W REINS NON-BIODEGRADABLE RX DEL   Vern Claude   ?[address]   ?[place], [place] [address]   ?Phone: [phone]   ?Fax: [phone]   Vern Claude   ?[address]   ?[place], [place] [address]   ?Phone: [phone]    ?Call [facility] for IUD ? ?Repeat pap due 09/01/2022  ?

## 2021-11-28 LAB — QUANTIFERON-TB GOLD PLUS
Mitogen-NIL: >10 IU/mL
NIL: 0.05 IU/mL
QuantiFERON-TB Gold Plus: NEGATIVE
TB1-NIL: 0.02 IU/mL
TB2-NIL: <0 IU/mL

## 2021-11-28 LAB — MEASLES/MUMPS/RUBELLA IMMUNITY
Mumps IgG: 95.7 AU/mL
Rubella: 4.09 Index
Rubeola IgG: 56.3 AU/mL

## 2021-11-28 LAB — HEPATITIS A ANTIBODY, TOTAL: Hepatitis A AB,Total: REACTIVE — AB

## 2021-11-28 LAB — VARICELLA ZOSTER ANTIBODY, IGG: Varicella IgG: 210.5 index

## 2021-11-29 ENCOUNTER — Telehealth: Payer: Self-pay

## 2021-11-29 NOTE — Telephone Encounter (Signed)
Lvm for pt to return call in regards to labs. ? ?Per Dr.Tracy: ?Mail pt copy of labs  ?Protected from hep A  ?Chickenpox protected  ?Protected from MMR (mumps/measles, rubella)  ?TB lab negative  ?

## 2021-12-23 ENCOUNTER — Encounter: Payer: Self-pay | Admitting: Obstetrics and Gynecology

## 2021-12-25 ENCOUNTER — Other Ambulatory Visit: Payer: Self-pay | Admitting: Internal Medicine

## 2021-12-25 DIAGNOSIS — K219 Gastro-esophageal reflux disease without esophagitis: Secondary | ICD-10-CM

## 2022-01-19 ENCOUNTER — Telehealth: Payer: Self-pay

## 2022-01-19 NOTE — Telephone Encounter (Signed)
Patient is requesting a copy of her immunization records.  Patient asked that we please call her when they are ready for pick-up.

## 2022-02-03 ENCOUNTER — Ambulatory Visit: Payer: Managed Care, Other (non HMO) | Admitting: Internal Medicine

## 2022-02-28 ENCOUNTER — Other Ambulatory Visit: Payer: Self-pay

## 2022-02-28 ENCOUNTER — Other Ambulatory Visit: Payer: Self-pay | Admitting: Internal Medicine

## 2022-02-28 DIAGNOSIS — K219 Gastro-esophageal reflux disease without esophagitis: Secondary | ICD-10-CM

## 2022-02-28 MED ORDER — PANTOPRAZOLE SODIUM 40 MG PO TBEC
40.0000 mg | DELAYED_RELEASE_TABLET | Freq: Every day | ORAL | 1 refills | Status: DC
  Filled 2022-02-28: qty 30, 30d supply, fill #0

## 2022-08-02 IMAGING — CR DG CHEST 1V
1 series · 1 of 1 positions shown · non-contrast
Comparison: Skull radiographs 10/26/2017. Chest radiographs
01/12/2020 and 10/26/2017.

CLINICAL DATA: Evaluate for shunt malfunction. Right-sided head
pressure. Shunt revision 1 year ago.

EXAM:
CHEST  1 VIEW; DG CERVICAL SPINE - 1 VIEW; SKULL - 1-3 VIEW

[dg chest 1 view]
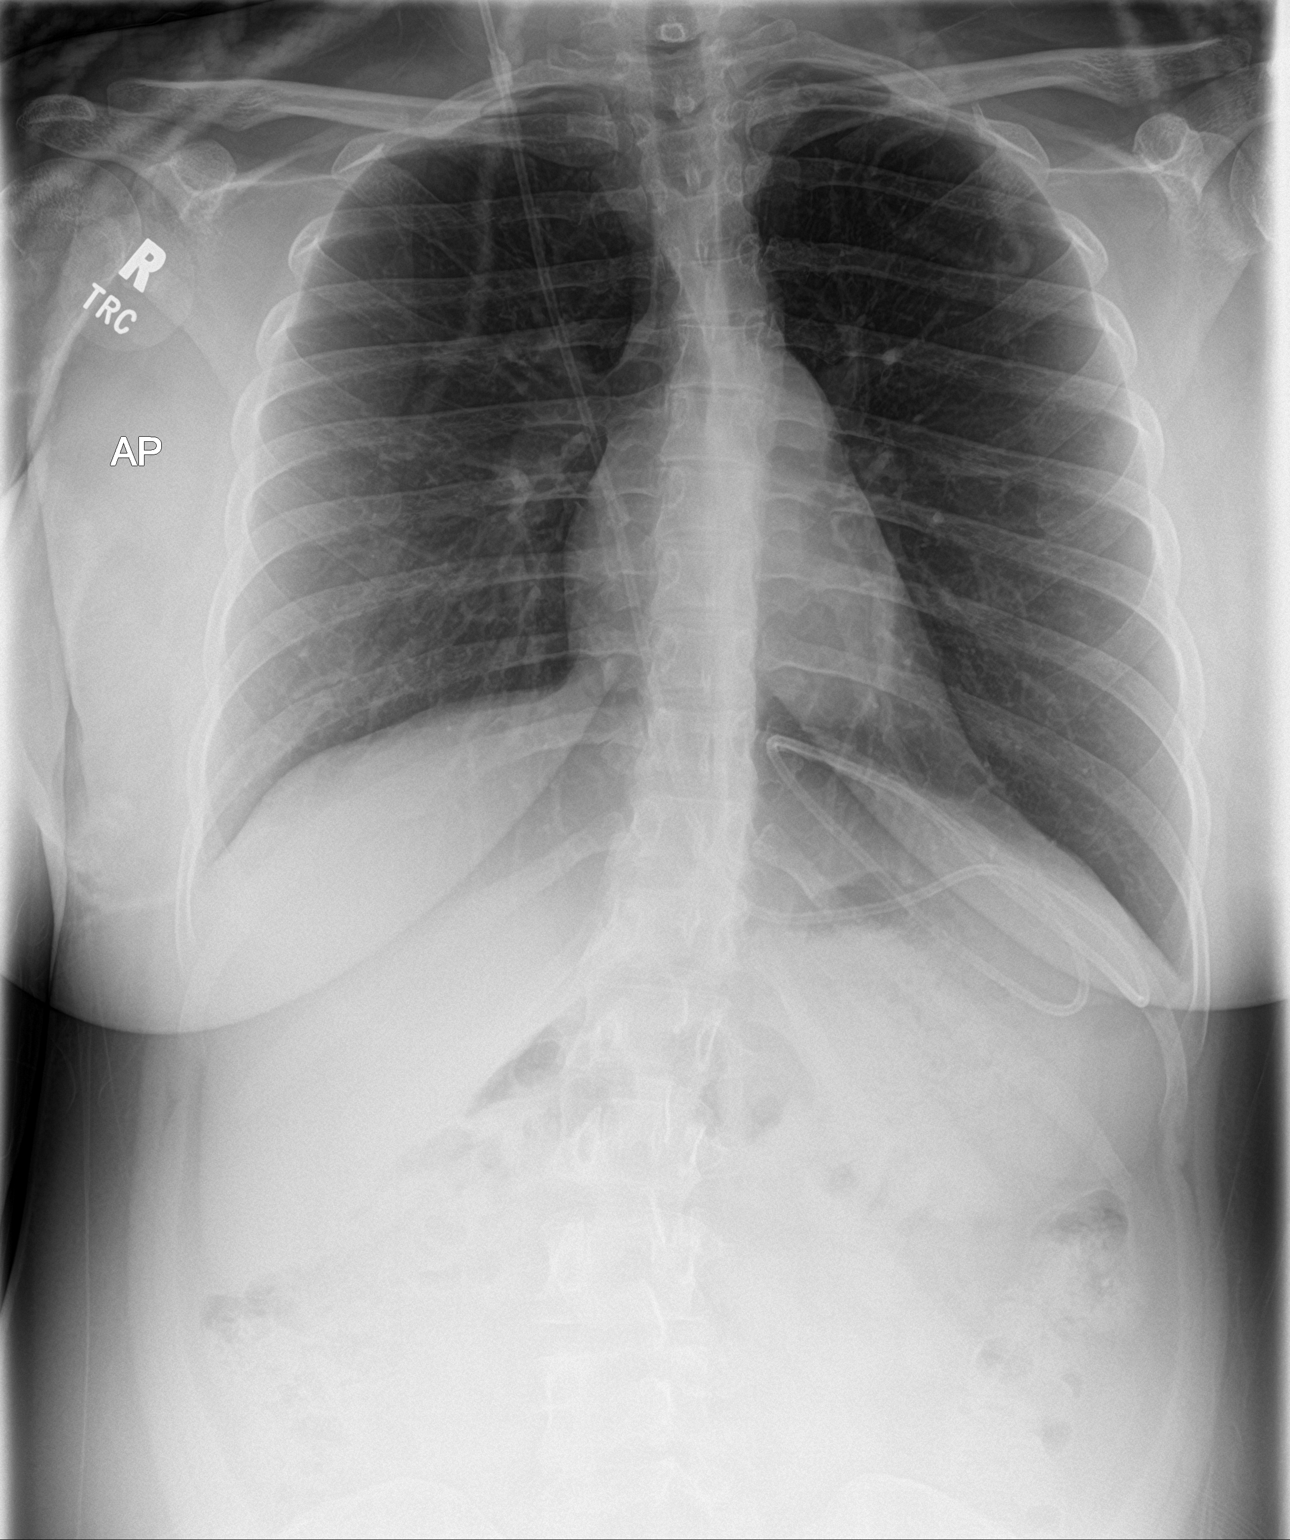

[1 of 1 positions shown; findings below may reference images not displayed]

FINDINGS: AP view of the skull demonstrates the radiopaque portions of the
right-sided ventricular peritoneal shunt to be intact. There are
postsurgical changes in the left skull.

The ventricular peritoneal shunt appears intact within the right
aspect of the neck.

The portions of the shunt overlying the right chest and left upper
quadrant of the abdomen appear intact. The heart size and
mediastinal contours are stable. The lungs are clear. There is no
pleural effusion or pneumothorax. No displaced bowel loops from the
left upper quadrant of the abdomen are seen.
IMPRESSION: The ventricular peritoneal shunt appears intact within the head,
neck, chest and upper abdomen. No acute findings identified.

## 2022-08-02 IMAGING — CR DG CERVICAL SPINE 1V
1 series · 1 of 1 positions shown · non-contrast
Comparison: Skull radiographs 10/26/2017. Chest radiographs
01/12/2020 and 10/26/2017.

CLINICAL DATA: Evaluate for shunt malfunction. Right-sided head
pressure. Shunt revision 1 year ago.

EXAM:
CHEST  1 VIEW; DG CERVICAL SPINE - 1 VIEW; SKULL - 1-3 VIEW

[dg cervical spine 1 view]
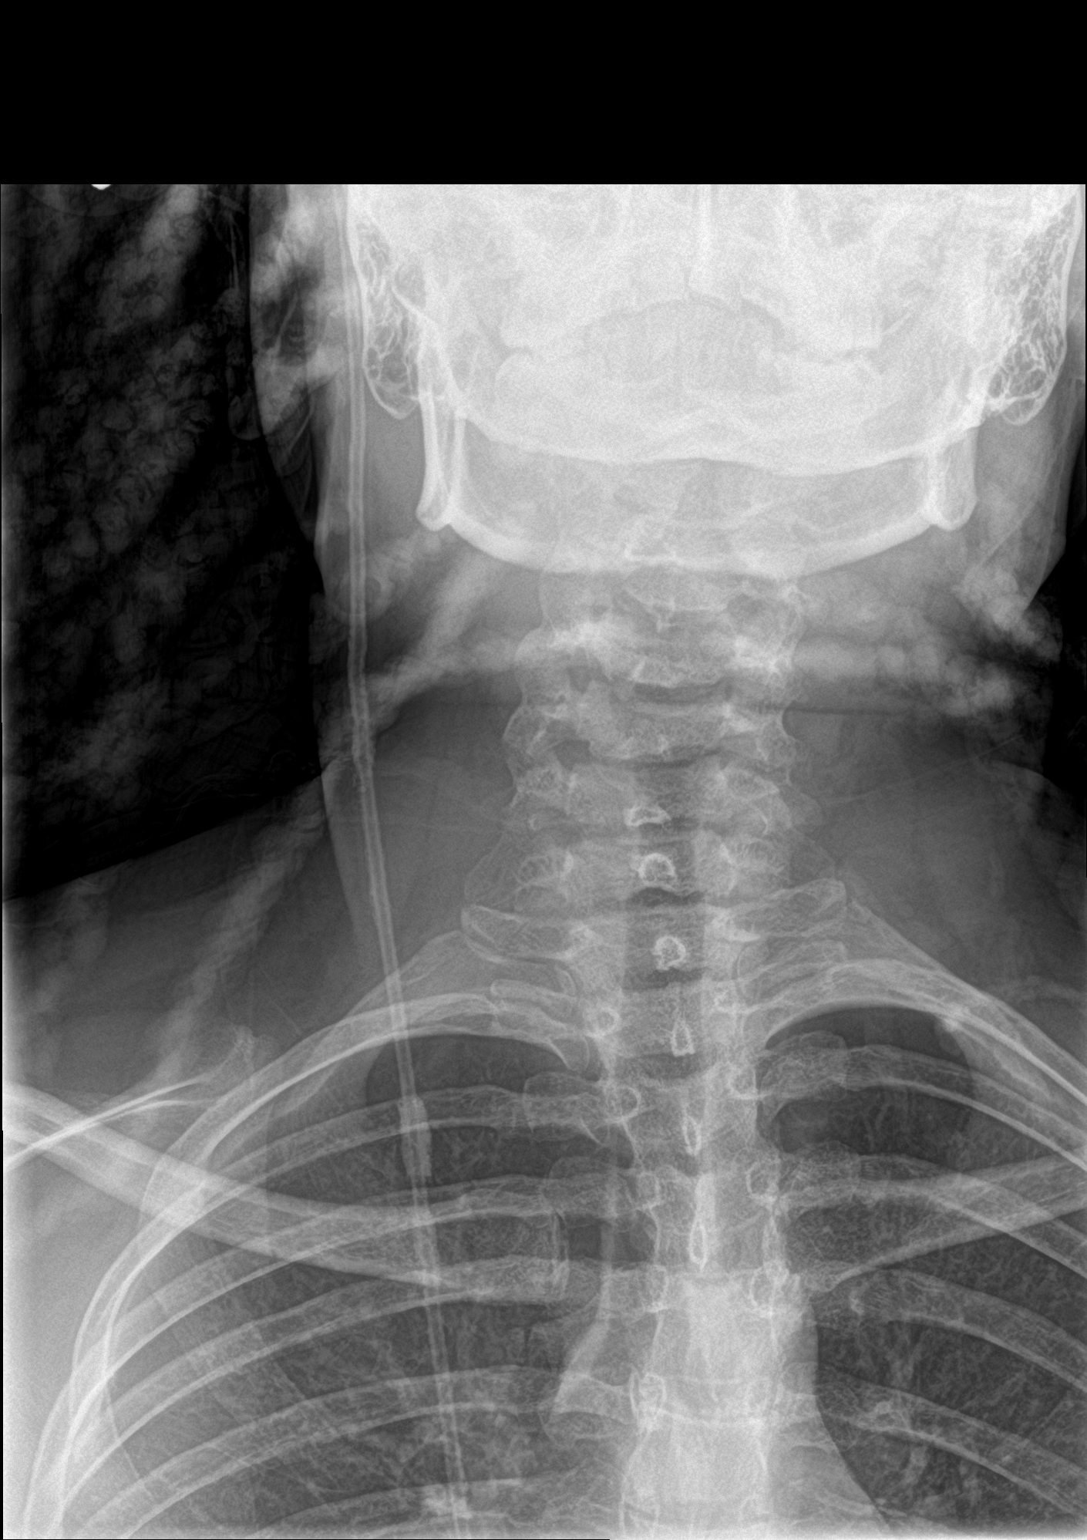

[1 of 1 positions shown; findings below may reference images not displayed]

FINDINGS: AP view of the skull demonstrates the radiopaque portions of the
right-sided ventricular peritoneal shunt to be intact. There are
postsurgical changes in the left skull.

The ventricular peritoneal shunt appears intact within the right
aspect of the neck.

The portions of the shunt overlying the right chest and left upper
quadrant of the abdomen appear intact. The heart size and
mediastinal contours are stable. The lungs are clear. There is no
pleural effusion or pneumothorax. No displaced bowel loops from the
left upper quadrant of the abdomen are seen.
IMPRESSION: The ventricular peritoneal shunt appears intact within the head,
neck, chest and upper abdomen. No acute findings identified.

## 2022-08-02 IMAGING — CR DG SKULL 1-3V
1 series · 1 of 1 positions shown · non-contrast
Comparison: Skull radiographs 10/26/2017. Chest radiographs
01/12/2020 and 10/26/2017.

CLINICAL DATA: Evaluate for shunt malfunction. Right-sided head
pressure. Shunt revision 1 year ago.

EXAM:
CHEST  1 VIEW; DG CERVICAL SPINE - 1 VIEW; SKULL - 1-3 VIEW

[dg skull 1-3 views]
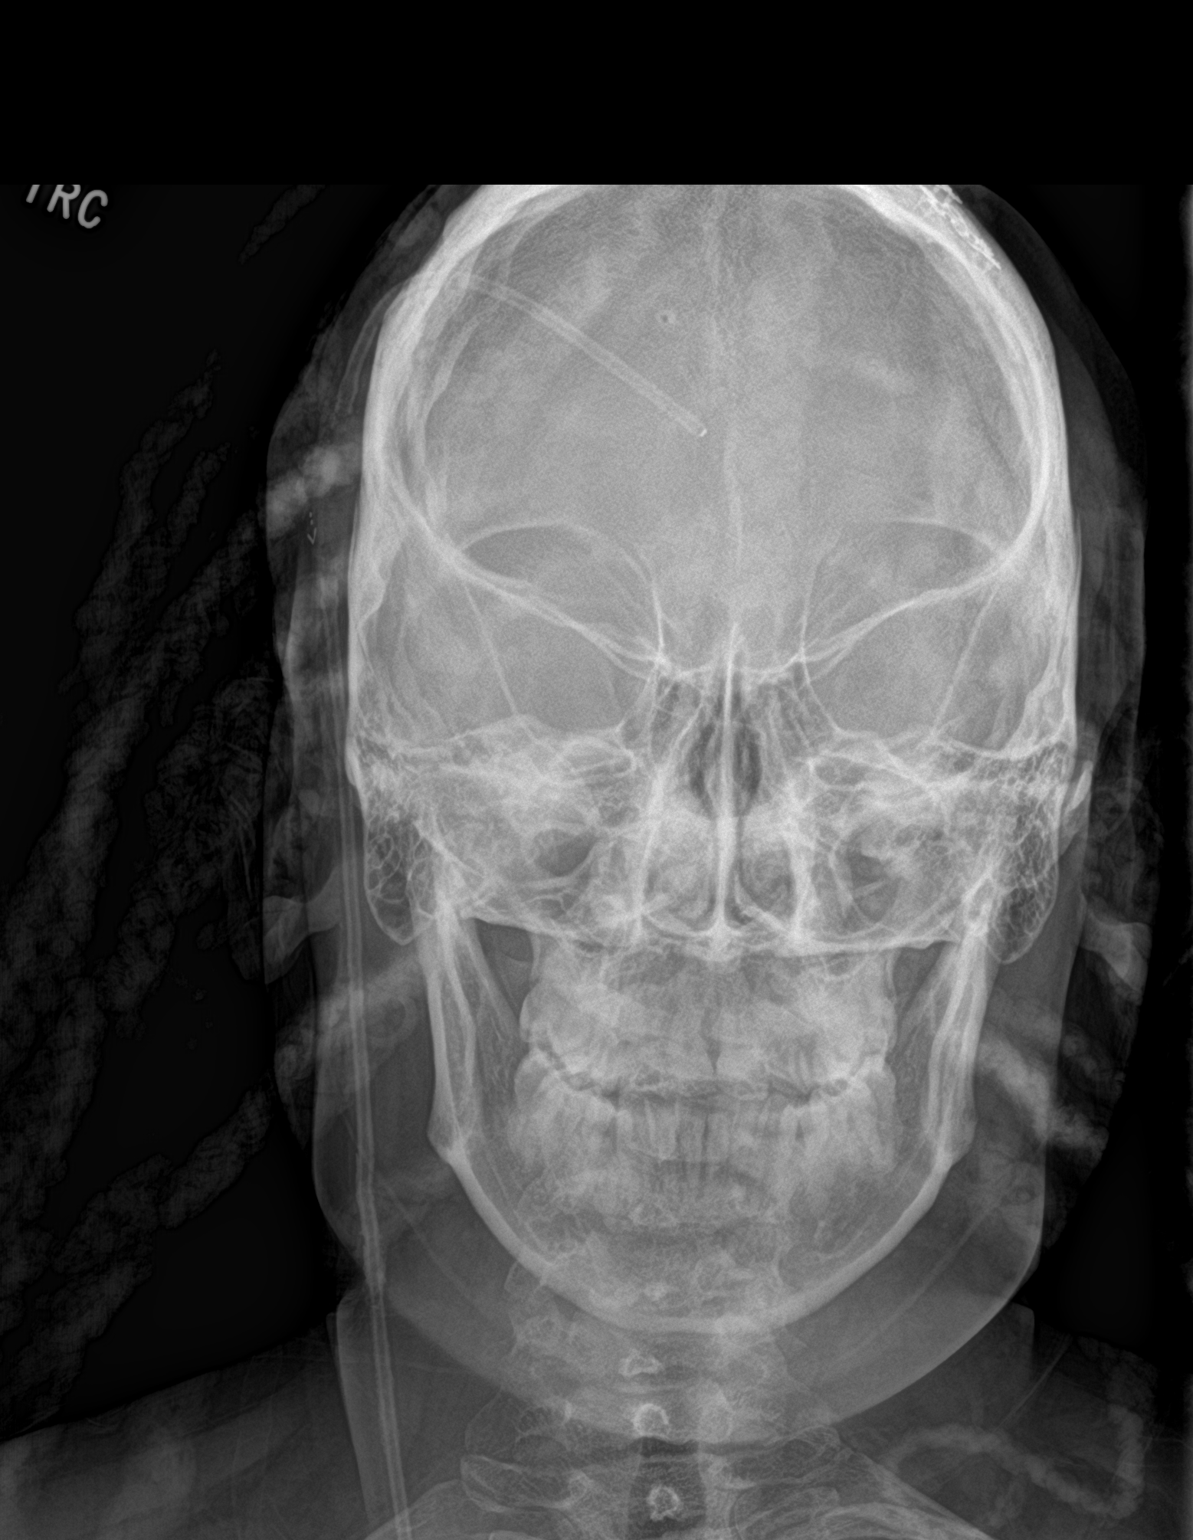

[1 of 1 positions shown; findings below may reference images not displayed]

FINDINGS: AP view of the skull demonstrates the radiopaque portions of the
right-sided ventricular peritoneal shunt to be intact. There are
postsurgical changes in the left skull.

The ventricular peritoneal shunt appears intact within the right
aspect of the neck.

The portions of the shunt overlying the right chest and left upper
quadrant of the abdomen appear intact. The heart size and
mediastinal contours are stable. The lungs are clear. There is no
pleural effusion or pneumothorax. No displaced bowel loops from the
left upper quadrant of the abdomen are seen.
IMPRESSION: The ventricular peritoneal shunt appears intact within the head,
neck, chest and upper abdomen. No acute findings identified.

## 2022-08-21 DIAGNOSIS — K589 Irritable bowel syndrome without diarrhea: Secondary | ICD-10-CM | POA: Diagnosis not present

## 2022-08-21 DIAGNOSIS — Z0130 Encounter for examination of blood pressure without abnormal findings: Secondary | ICD-10-CM | POA: Diagnosis not present

## 2022-08-21 DIAGNOSIS — I10 Essential (primary) hypertension: Secondary | ICD-10-CM | POA: Diagnosis not present

## 2022-08-21 DIAGNOSIS — Z1389 Encounter for screening for other disorder: Secondary | ICD-10-CM | POA: Diagnosis not present

## 2022-08-21 DIAGNOSIS — G918 Other hydrocephalus: Secondary | ICD-10-CM | POA: Diagnosis not present

## 2022-08-21 DIAGNOSIS — H548 Legal blindness, as defined in USA: Secondary | ICD-10-CM | POA: Diagnosis not present

## 2022-08-21 DIAGNOSIS — Z23 Encounter for immunization: Secondary | ICD-10-CM | POA: Diagnosis not present

## 2022-11-17 ENCOUNTER — Encounter: Payer: Self-pay | Admitting: Cardiology

## 2022-11-17 ENCOUNTER — Ambulatory Visit: Payer: Managed Care, Other (non HMO) | Attending: Cardiology | Admitting: Cardiology

## 2022-11-17 VITALS — BP 107/75 | HR 100 | Ht <= 58 in | Wt 143.6 lb

## 2022-11-17 DIAGNOSIS — R000 Tachycardia, unspecified: Secondary | ICD-10-CM | POA: Diagnosis not present

## 2022-11-17 DIAGNOSIS — K581 Irritable bowel syndrome with constipation: Secondary | ICD-10-CM

## 2022-11-17 DIAGNOSIS — G4731 Primary central sleep apnea: Secondary | ICD-10-CM | POA: Diagnosis not present

## 2022-11-17 DIAGNOSIS — K219 Gastro-esophageal reflux disease without esophagitis: Secondary | ICD-10-CM | POA: Diagnosis not present

## 2022-11-17 MED ORDER — METOPROLOL SUCCINATE ER 50 MG PO TB24: 100.0000 mg | ORAL_TABLET | Freq: Every day | ORAL | 3 refills | Status: DC

## 2022-11-17 NOTE — Progress Notes (Unsigned)
Cardiology Office Note:   Date:  11/19/2022  ID:  Dawn Stark, DOB Jun 03, 2001, MRN [id number]  History of Present Illness:   Dawn Stark is a 22 y.o. female with past medical history of syncope, chronic constipation, blindness, headaches, sinus tachycardia, palpitations, gastroesophageal disease, migraines, hypertension, seizures, who is here today for follow-up.  She was last seen in clinic 05/19/2020 after previously being evaluated in the emergency department for chest pain and palpitations.  She had been going to see GI as well and was taking Protonix twice per day and continue to report fatigue with metoprolol as well as breakthrough tachypalpitations.  Testing was reviewed her echo had a normal EF without significant findings, labs without significant findings and ZIO monitor was still pending but it was suspected that GI etiology of chest pain as outlined in previous notes given that her chest pain worsened when lying down and improved since she transition from Prilosec to Protonix.  She was transitioned off of metoprolol to Cardizem and had previously been on atenolol but it was later found that she did not tolerate the Cardizem as well.  She returns today accompanied by her mother stating that she is still having palpitations and elevated heart rates.  And with the elevated heart rates she is having a lot more fatigue and shortness of breath.  Her mother was asking today if she can have fatigue from having an elevated heart rate as she was thinking that she was just being lazy.  Patient is in college now even though she is legally blind and has been doing well but she has noted an increased amount of fatigue since her heart rate has been increasingly elevated for quite some time.  She denies any chest pain, swelling to her bilateral lower extremities, lightheadedness/dizziness or syncopal/near syncopal episodes.  She denies any recent hospitalizations but did have 1 visit to the emergency  department recently.  ROS: 10 point review of systems has been completed and is considered negative with the exception of what is been listed in the HPI  Studies Reviewed:    EKG: Sinus tachycardia with rate of 100, nonspecific T wave abnormalities, and LVH, no acute change from prior studies  TTE 05/05/20 1. Left ventricular ejection fraction, by estimation, is 55 to 60%. The  left ventricle has normal function. The left ventricle has no regional  wall motion abnormalities. Left ventricular diastolic parameters were  normal.   2. Right ventricular systolic function is normal. The right ventricular  size is normal.   3. The mitral valve is normal in structure. No evidence of mitral valve  regurgitation. No evidence of mitral stenosis.   4. The aortic valve is normal in structure. Aortic valve regurgitation is  not visualized. No aortic stenosis is present.   Risk Assessment/Calculations:           Physical Exam:   VS:  BP 107/75 (BP Location: Left Arm, Patient Position: Sitting, Cuff Size: Normal)   Pulse 100   Ht 4\' 9"  (1.448 m)   Wt 143 lb 9.6 oz (65.1 kg)   SpO2 98%   BMI 31.07 kg/m    Wt Readings from Last 3 Encounters:  11/17/22 143 lb 9.6 oz (65.1 kg)  11/25/21 130 lb 9.6 oz (59.2 kg)  09/01/21 131 lb 3.2 oz (59.5 kg)     GEN: Well nourished, well developed in no acute distress NECK: No JVD; No carotid bruits CARDIAC: RRR, tachycardic, no murmurs, rubs, gallops RESPIRATORY:  Clear to  auscultation without rales, wheezing or rhonchi  ABDOMEN: Soft, non-tender, non-distended EXTREMITIES:  No edema; No deformity   ASSESSMENT AND PLAN:   Sinus tachycardia/EKG today.  She does have fatigue with some shortness of breath with the elevated heart rate.  She previously did not tolerate atenolol or diltiazem.  She is on Toprol-XL 50 mg at bedtime will increase that to 100 mg to see how she tolerates as well as sending her for blood work today of a CBC, BMET, TSH, and lipid  panel.  She has also been advised to remain hydrated and avoid caffeine.  Will consider repeating echocardiogram if symptoms of not improved after return to rule out tachycardia induced cardiomyopathy.  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  She was previously on CPAP and she no longer utilizes.  Has sleep apnea can be a contributing factor to her sinus tachycardia and ultimately could be the cause of an irregular heart rhythm such as atrial fibrillation.  Recommended reestablishing care with pulmonary.  Longstanding history of gastroesophageal reflux disease without esophagitis.  She is back and taking a Prilosec 40 mg twice daily before meals and is no longer on Protonix.  This continues to be monitored by her PCP.  She continues to have irritable bowel syndrome with constipation that is monitored by her PCP and she has been continued on Linzess.  Disposition patient is to return to clinic to see MD/APP in 4 weeks to reevaluate symptoms and follow-up on lab work and any further testing can consider echocardiogram and ZIO at that time.        Signed, Brennah Quraishi, NP

## 2022-11-17 NOTE — Patient Instructions (Signed)
Medication Instructions:  Your physician has recommended you make the following change in your medication:   metoprolol succinate (TOPROL-XL) 50 MG 24 hr tablet - Take 2 tablets (100 mg total) by mouth at bedtime  *If you need a refill on your cardiac medications before your next appointment, please call your pharmacy*  Lab Work: Your physician recommends that you get lab work today: CBC, BMET, TSH, & lipid Pharmacologist at [facility] 1st desk on the right to check in (REGISTRATION)  Lab hours: Monday- Friday (7:30 am- 5:30 pm)   If you have labs (blood work) drawn today and your tests are completely normal, you will receive your results only by: MyChart Message (if you have MyChart) OR A paper copy in the mail If you have any lab test that is abnormal or we need to change your treatment, we will call you to review the results.   Testing/Procedures: -None ordered  Follow-Up: At [facility], you and your health needs are our priority.  As part of our continuing mission to provide you with exceptional heart care, we have created designated Provider Care Teams.  These Care Teams include your primary Cardiologist (physician) and Advanced Practice Providers (APPs -  Physician Assistants and Nurse Practitioners) who all work together to provide you with the care you need, when you need it.  We recommend signing up for the patient portal called "MyChart".  Sign up information is provided on this After Visit Summary.  MyChart is used to connect with patients for Virtual Visits (Telemedicine).  Patients are able to view lab/test results, encounter notes, upcoming appointments, etc.  Non-urgent messages can be sent to your provider as well.   To learn more about what you can do with MyChart, go to [email or web address].    Your next appointment:   1 month(s)  Provider:   You may see Julien Nordmann, MD or one of the following Advanced Practice Providers on your designated  Care Team:   Nicolasa Ducking, NP Eula Listen, PA-C Cadence Fransico Michael, PA-C Charlsie Quest, NP    Other Instructions -None

## 2022-12-22 ENCOUNTER — Ambulatory Visit: Payer: Managed Care, Other (non HMO) | Attending: Cardiology | Admitting: Cardiology

## 2022-12-22 ENCOUNTER — Other Ambulatory Visit
Admission: RE | Admit: 2022-12-22 | Discharge: 2022-12-22 | Disposition: A | Discharge status: Home / Self Care | Payer: Managed Care, Other (non HMO) | Source: Ambulatory Visit | Attending: Cardiology | Admitting: Cardiology

## 2022-12-22 ENCOUNTER — Ambulatory Visit: Payer: Managed Care, Other (non HMO)

## 2022-12-22 ENCOUNTER — Encounter: Payer: Self-pay | Admitting: Cardiology

## 2022-12-22 VITALS — BP 110/76 | HR 113 | Ht <= 58 in | Wt 144.4 lb

## 2022-12-22 DIAGNOSIS — K581 Irritable bowel syndrome with constipation: Secondary | ICD-10-CM

## 2022-12-22 DIAGNOSIS — R000 Tachycardia, unspecified: Secondary | ICD-10-CM

## 2022-12-22 DIAGNOSIS — R002 Palpitations: Secondary | ICD-10-CM | POA: Insufficient documentation

## 2022-12-22 DIAGNOSIS — G4731 Primary central sleep apnea: Secondary | ICD-10-CM | POA: Diagnosis not present

## 2022-12-22 DIAGNOSIS — I10 Essential (primary) hypertension: Secondary | ICD-10-CM | POA: Diagnosis not present

## 2022-12-22 DIAGNOSIS — K219 Gastro-esophageal reflux disease without esophagitis: Secondary | ICD-10-CM | POA: Diagnosis not present

## 2022-12-22 DIAGNOSIS — R5383 Other fatigue: Secondary | ICD-10-CM

## 2022-12-22 DIAGNOSIS — R0602 Shortness of breath: Secondary | ICD-10-CM | POA: Diagnosis not present

## 2022-12-22 LAB — CBC
HCT: 37.9 % (ref 36.0–46.0)
Hemoglobin: 12.8 g/dL (ref 12.0–15.0)
MCH: 30.2 pg (ref 26.0–34.0)
MCHC: 33.8 g/dL (ref 30.0–36.0)
MCV: 89.4 fL (ref 80.0–100.0)
Platelets: 312 10*3/uL (ref 150–400)
RBC: 4.24 MIL/uL (ref 3.87–5.11)
RDW: 12.1 % (ref 11.5–15.5)
WBC: 10.2 10*3/uL (ref 4.0–10.5)
nRBC: 0 % (ref 0.0–0.2)

## 2022-12-22 LAB — BASIC METABOLIC PANEL
Anion gap: 11 (ref 5–15)
BUN: 10 mg/dL (ref 6–20)
CO2: 24 mmol/L (ref 22–32)
Calcium: 9.3 mg/dL (ref 8.9–10.3)
Chloride: 104 mmol/L (ref 98–111)
Creatinine, Ser: 0.88 mg/dL (ref 0.44–1.00)
GFR, Estimated: >60 mL/min (ref >60)
Glucose, Bld: 90 mg/dL (ref 70–99)
Potassium: 3.8 mmol/L (ref 3.5–5.1)
Sodium: 139 mmol/L (ref 135–145)

## 2022-12-22 LAB — LIPID PANEL
Cholesterol: 206 mg/dL — ABNORMAL HIGH (ref 0–200)
HDL: 40 mg/dL — ABNORMAL LOW (ref >40)
LDL Cholesterol: 131 mg/dL — ABNORMAL HIGH (ref 0–99)
Total CHOL/HDL Ratio: 5.2 RATIO
Triglycerides: 176 mg/dL — ABNORMAL HIGH (ref <150)
VLDL: 35 mg/dL (ref 0–40)

## 2022-12-22 LAB — TSH: TSH: 1.417 u[IU]/mL (ref 0.350–4.500)

## 2022-12-22 MED ORDER — VERAPAMIL HCL ER 180 MG PO TBCR: 180.0000 mg | EXTENDED_RELEASE_TABLET | Freq: Every day | ORAL | 3 refills | Status: DC

## 2022-12-22 NOTE — Progress Notes (Signed)
Cardiology Office Note:   Date:  12/22/2022  ID:  Dawn Stark, DOB 2000-08-05, MRN [id number] PCP: Leanna Sato, MD  [facility] Providers Cardiologist:  Julien Nordmann, MD    History of Present Illness:   Dawn Stark is a 22 y.o. female with a past medical history of syncope, chronic constipation, blindness,'s, sinus tachycardia, palpitations, gastroesophageal reflux disease, migraines, hypertension, seizures, who is here today for follow-up.  Previous echocardiogram revealed normal EF without other significant findings.   Seen in clinic 11/17/2019 for chest palpitations and elevated heart rates.  She was accompanied by her mother.  Mother had several questions related to fatigue and was wondering if palpitations could make her tired if she was just being lazy.  She has not had any other related symptoms.  Toprol-XL was increased to 100 mg at bedtime she was also sent for lab work and advised to remain hydrated and avoid caffeine.  She was also recommended to reestablish care with pulmonary as she has longstanding history of central sleep apnea was not longer utilizing her CPAP.  She returns to clinic today accompanied by her mother.  She states unfortunately pharmacy would not refill her metoprolol she has been off the medication for the past 2 weeks but she continues to have extremely fast heart rates even on the medication.  She is on multiple medications and she has been off work for the past such as atenolol, diltiazem, propranolol and 2 other ones that she is unable to remember the name.  On her last visit she was sent for blood work but unfortunately did not have her blood work done.  She denies any hospitalizations or visits to the emergency department.  ROS: 10 point review of systems has been reviewed and considered negative with exception of what is listed in the HPI  Studies Reviewed:    EKG: No new tracings were completed today  TTE 05/05/20 1. Left ventricular  ejection fraction, by estimation, is 55 to 60%. The  left ventricle has normal function. The left ventricle has no regional  wall motion abnormalities. Left ventricular diastolic parameters were  normal.   2. Right ventricular systolic function is normal. The right ventricular  size is normal.   3. The mitral valve is normal in structure. No evidence of mitral valve  regurgitation. No evidence of mitral stenosis.   4. The aortic valve is normal in structure. Aortic valve regurgitation is  not visualized. No aortic stenosis is present.   Risk Assessment/Calculations:              Physical Exam:   VS:  BP 110/76 (BP Location: Left Arm, Patient Position: Sitting, Cuff Size: Normal)   Pulse (!) 113   Ht 4\' 9"  (1.448 m)   Wt 144 lb 6.4 oz (65.5 kg)   SpO2 99%   BMI 31.25 kg/m    Wt Readings from Last 3 Encounters:  12/22/22 144 lb 6.4 oz (65.5 kg)  11/17/22 143 lb 9.6 oz (65.1 kg)  11/25/21 130 lb 9.6 oz (59.2 kg)     GEN: Well nourished, well developed in no acute distress NECK: No JVD; No carotid bruits CARDIAC: RRR, no murmurs, rubs, gallops RESPIRATORY:  Clear to auscultation without rales, wheezing or rhonchi  ABDOMEN: Soft, non-tender, non-distended EXTREMITIES:  No edema; No deformity   ASSESSMENT AND PLAN:   Sinus tachycardia with a rate of 113 today.  She continues to have fatigue and shortness of breath with occasional chest discomfort  with elevated heart rates.  She previously did not tolerate multiple medications atenolol, diltiazem, propranolol, and now stating that she has not tolerated metoprolol.  She has been sent in for further blood work CBC, BMP, and TSH as well as lipid panel.  She continues to remain hydrated and advised to avoid caffeine.  Will start her on verapamil 180 mg to take at bedtime.  She is also being placed on a ZIO XT monitor for the next 7 days to evaluate sinus tachycardia versus atrial flutter.  Will change her course of treatment.  Further  recommendations to follow.  Patient states that she wore a monitor in the past but no report was found in her record.  Will consider repeating echocardiogram on return due to associated symptoms such as abdominal sinus tachycardia.  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  Previously was on CPAP but still no longer utilizes.  Sleep apnea could be a contributing factor to elevated heart rates or irregular heart rhythm such as atrial fibrillation.  Continue to recommend reestablishing care with pulmonary.  Gastroesophageal reflux disease without esophagitis.  She is taking Prilosec 20 mg daily.  This continues to be monitored by her PCP.  She is also continues to suffer from IBS with constipation that is monitored by her PCP.  She is also continued on Linzess.  Disposition patient return to clinic in 6 weeks to reevaluate symptoms and follow-up on ZIO monitoring.        Signed, Arren Laminack, NP

## 2022-12-22 NOTE — Patient Instructions (Signed)
Medication Instructions:  Your physician has recommended you make the following change in your medication:   START - verapamil (CALAN-SR) 180 MG CR tablet - Take 1 tablet (180 mg total) by mouth at bedtime  *If you need a refill on your cardiac medications before your next appointment, please call your pharmacy*  Lab Work: Your physician recommends that you get lab work: TSH, lipid, CBC, & BMP [facility] Entrance at [facility] 1st desk on the right to check in (REGISTRATION)  Lab hours: Monday- Friday (7:30 am- 5:30 pm)  If you have labs (blood work) drawn today and your tests are completely normal, you will receive your results only by: MyChart Message (if you have MyChart) OR A paper copy in the mail If you have any lab test that is abnormal or we need to change your treatment, we will call you to review the results.  Testing/Procedures: Heart Monitor:  Your physician has requested you wear a ZIOXT monitor for 7 days.  Your monitor will be mailed to your home address within 3-5 business days. This is sent via [employer] from [employer]. However, if you have not received your monitor after 5 business days please send [place] a MyChart message or call the office at [phone], so we may follow up on this for you.   This monitor is a medical device (single patch monitor) that records the heart's electrical activity. Doctors most often use these monitors to diagnose arrhythmias. Arrhythmias are problems with the speed or rhythm of the heartbeat.   [employer] supplies 1 patch per enrollment. Additional stickers are not available.  Please DO NOT apply the patch if you will be having a Nuclear Stress Test, Echocardiogram, Cardiac CT, Cardiac MRI, Chest X-Trillo during the period you would be wearing the monitor. The patch cannot be worn during these tests.  You cannot remove and re-apply the ZIO patch monitor.   Applying the Monitor: Once you receive your monitor, this will include a small  razor, abrader, and 4 alcohol pads. Shave hair from upper left chest Rub abrader disc in 40 strokes over the left upper chest as indicated in your monitor instructions Clean area with 4 enclosed alcohol pads (there may be a mild & brief stinging sensation over the newly abraded area, but this is normal). Let dry Apply patch as indicated in monitor instructions. Patch will be placed under collarbone on the left side of the chest with arrow pointing upward. Rub adhesive wings for 2 minutes. Remove white label marked "1". Remove the white label marked "2". Rub patch adhesive wings for an 2 minutes.  While looking in a mirror, press and release button in the center of the patch. You may hear a "click". A small green light will flash 4-6 times and then stop. This will be your indicator that the monitor has been turned on.  Wearing the Monitor: Avoid showering during the first 24 hours of wearing the monitor.  After 24 hours you may shower with the patch on. Take brief showers with your back facing the shower head.  Avoid excessive sweating to help maximize wear time. Do not submerge the device, no hot tubs, and no swimming pools. Keep any lotions or oils away from the patch. Press the button if you feel a symptom. You will hear a small click. Record date, time, and symptoms in the Patient Logbook or App.  Monitor Issues: Call [employer] Customer Care at [phone] if you have questions regarding your Zio Patch  Monitor. Call them immediately if you see an orange/ amber colored light blinking on your monitor. If your monitor falls off and you cannot get this reapplied or if you need suggestions for securing your monitor call [employer] at [phone].   Returning the Monitor: Once you have completed wearing your monitor, follow instructions on the last 2 pages of the Patient Logbook. Stick monitor patch on to the last page of the Patient Logbook.  Place Patient Logbook with monitor in  the return box provided. Use locking tab on box and tape box closed securely. The return box has pre-paid postage on it.  Place the return box in the regular [place] Mail box as soon as possible It will take anywhere from 1-2 weeks for your provider to receive and review your results once you mail this back. If for some reason you have misplaced your return box then call our office and we can provide another box and/or mail it off for you.   Billing  and Patient Assistance Program Information: We have supplied [employer] with any of your insurance information on file for billing purposes. [employer] offers a sliding scale Patient Assistance Program for patients that do not have insurance, or whose insurance does not completely cover the cost of the ZIO monitor. You must apply for the Patient Assistance Program to qualify for this discounted rate. To apply, please call [employer] at [phone], select option 1, ask to apply for the Patient Assistance Program. [employer] will ask your household income, and how many people are in your household. They will quote your out-of-pocket cost based on that information. [employer] will also be able to set up for a 33-month, interest-free payment plan if needed.      Follow-Up: At [facility], you and your health needs are our priority.  As part of our continuing mission to provide you with exceptional heart care, we have created designated Provider Care Teams.  These Care Teams include your primary Cardiologist (physician) and Advanced Practice Providers (APPs -  Physician Assistants and Nurse Practitioners) who all work together to provide you with the care you need, when you need it.  We recommend signing up for the patient portal called "MyChart".  Sign up information is provided on this After Visit Summary.  MyChart is used to connect with patients for Virtual Visits (Telemedicine).  Patients are able to view lab/test results, encounter notes, upcoming  appointments, etc.  Non-urgent messages can be sent to your provider as well.   To learn more about what you can do with MyChart, go to [email or web address].    Your next appointment:   6 week(s)  Provider:   You may see Julien Nordmann, MD or one of the following Advanced Practice Providers on your designated Care Team:   Nicolasa Ducking, NP Eula Listen, PA-C Cadence Fransico Michael, PA-C Charlsie Quest, NP    Other Instructions -None

## 2022-12-25 DIAGNOSIS — R000 Tachycardia, unspecified: Secondary | ICD-10-CM | POA: Diagnosis not present

## 2022-12-25 DIAGNOSIS — R002 Palpitations: Secondary | ICD-10-CM | POA: Diagnosis not present

## 2022-12-25 NOTE — Progress Notes (Signed)
Electrolytes, kidney function, thyroid, and blood counts are all stable. LDL (bad cholesterol) remains elevated and triglycerides work on increasing activity and decreasing fast, fried, fatty foods

## 2023-01-03 NOTE — Telephone Encounter (Signed)
Patient returned call. Patient states that she is feeling better with the Verapamil. She states that she is not as tired. Reports heart rate of 107 and blood pressure of 99/86 last night. Patient informed of recommendations of increasing water intake and taking Miralax daily. Patient verbalized understanding.

## 2023-01-03 NOTE — Telephone Encounter (Signed)
Called patient no answer at this time. Voice mail left for patient to call back.

## 2023-01-22 NOTE — Progress Notes (Signed)
Average heart rate was 102. No irregular heart rhythms noted like atrial fibrillation/atrial flutter. Recommend continuing current medication regimen.

## 2023-02-09 ENCOUNTER — Encounter: Payer: Self-pay | Admitting: Cardiology

## 2023-02-09 ENCOUNTER — Ambulatory Visit: Payer: Managed Care, Other (non HMO) | Attending: Cardiology | Admitting: Cardiology

## 2023-02-09 VITALS — BP 120/78 | HR 97 | Ht <= 58 in | Wt 143.4 lb

## 2023-02-09 DIAGNOSIS — E669 Obesity, unspecified: Secondary | ICD-10-CM

## 2023-02-09 DIAGNOSIS — R002 Palpitations: Secondary | ICD-10-CM | POA: Diagnosis not present

## 2023-02-09 DIAGNOSIS — K5909 Other constipation: Secondary | ICD-10-CM

## 2023-02-09 DIAGNOSIS — R000 Tachycardia, unspecified: Secondary | ICD-10-CM

## 2023-02-09 DIAGNOSIS — K219 Gastro-esophageal reflux disease without esophagitis: Secondary | ICD-10-CM

## 2023-02-09 DIAGNOSIS — G4731 Primary central sleep apnea: Secondary | ICD-10-CM

## 2023-02-09 NOTE — Progress Notes (Signed)
Cardiology Office Note:  .   Date:  02/09/2023  ID:  Dawn Stark, DOB 03/21/2001, MRN [id number] PCP: Leanna Sato, MD  [facility] Providers Cardiologist:  Julien Nordmann, MD    History of Present Illness: .   Dawn Stark is a 22 y.o. female with a past medical history of syncope, chronic constipation, blindness's, sinus tachycardia, palpitations, GERD, migraines, HTN, seizures, who is here today for follow-up of her palpitations and recent medication changes.   Echocardiogram revealed normal EF without other significant findings.  She was last seen in clinic 12/22/22 and had been out of metoprolol for 2 weeks. She was found to be tachycardiac. She was started on verapamil 180 mg daily as she started that the metoprolol was not working. Referral was sent to Pulmonary for sleep apnea. Placed on a Zio monitor.   She returns to clinic today accompanied by her mother and their new service dog. She has been doing well with improvement noted in her palpitations since being on verapamil. She continues to have the chronic abdominal issues. She has increased her activity and [place] to [place] her activity. She has just recently traveled to [place].  Denies any chest pain, shortness of breath, or peripheral edema. Has yet to hear from her Pulmonary referral. Denies any hospitalizations or visits to the emergency department.   ROS: 10 point review of systems has been reviewed and considered negative with the exception of what is listed in the HPI.  Studies Reviewed: Marland Kitchen   EKG Interpretation Date/Time:  Friday February 09 2023 14:25:50 EDT Ventricular Rate:  97 PR Interval:  120 QRS Duration:  76 QT Interval:  346 QTC Calculation: 439 R Axis:   57  Text Interpretation: Normal sinus rhythm Nonspecific T wave abnormality When compared with ECG of 15-May-2020 12:51, No significant change was found Confirmed by Charlsie Quest ([id number]) on 02/09/2023 2:35:03 PM   Heart  Monitor 01/18/23  Event monitor Patch Wear Time:  6 days and 2 hours (2024-06-03T20:55:01-399 to 2024-06-09T23:24:13-0400)   Normal sinus rhythm Patient had a min HR of 72 bpm, max HR of 171 bpm, and avg HR of 102 bpm.   Isolated SVEs were rare (<1.0%), and no SVE Couplets or SVE Triplets were present.  Isolated VEs were rare (<1.0%), and no VE Couplets or VE Triplets were present.    Patient triggered events (81) associated with normal sinus rhythm and sinus tachycardia  TTE 05/05/20 1. Left ventricular ejection fraction, by estimation, is 55 to 60%. The  left ventricle has normal function. The left ventricle has no regional  wall motion abnormalities. Left ventricular diastolic parameters were  normal.   2. Right ventricular systolic function is normal. The right ventricular  size is normal.   3. The mitral valve is normal in structure. No evidence of mitral valve  regurgitation. No evidence of mitral stenosis.   4. The aortic valve is normal in structure. Aortic valve regurgitation is  not visualized. No aortic stenosis is present.  Risk Assessment/Calculations:             Physical Exam:   VS:  BP 120/78 (BP Location: Left Arm, Patient Position: Sitting, Cuff Size: Normal)   Pulse 97   Ht 4\' 9"  (1.448 m)   Wt 143 lb 6.4 oz (65 kg)   SpO2 98%   BMI 31.03 kg/m    Wt Readings from Last 3 Encounters:  02/09/23 143 lb 6.4 oz (65 kg)  12/22/22 144 lb 6.4 oz (65.5 kg)  11/17/22 143 lb 9.6 oz (65.1 kg)    GEN: Well nourished, well developed in no acute distress NECK: No JVD; No carotid bruits CARDIAC: RRR, no murmurs, rubs, gallops RESPIRATORY:  Clear to auscultation without rales, wheezing or rhonchi  ABDOMEN: Soft, non-tender, non-distended EXTREMITIES:  No edema; No deformity   ASSESSMENT AND PLAN: .   Sinus tachycardia that has improved. Palpitations have decreased. She was previously started on Verapamil 180 mg at bedtime. Since that time her heart rate has been  better controlled and her palpitations have decreased. Zio monitor revealed a min HR of 72 bpm, max HR of 171 bpm, and avg HR of 102 bpm. No atrial fibrillation or atrial flutter. EKG today was sinus rate of 97 with no ischemic changes noted.  Sleep apnea without CPAP. Has not been followed by Pulmonary in several years Referral sent to Pulmonary for sleep apnea.   GERD without esophagitis. She has been continued on PPI therapy that is managed by her PCP.   IBS with chronic constipation. She is continued on linzess. This is also managed by her PCP.  BMI 31.03. Encouraged to continue exercise. She has [place]        Dispo: Patient to return to clinic to see MD/APP in 6 months or sooner if needed.   Signed, Deziya Amero, NP

## 2023-02-09 NOTE — Patient Instructions (Signed)
Medication Instructions:  The current medical regimen is effective;  continue present plan and medications.  *If you need a refill on your cardiac medications before your next appointment, please call your pharmacy*   Follow-Up: At [facility], you and your health needs are our priority.  As part of our continuing mission to provide you with exceptional heart care, we have created designated Provider Care Teams.  These Care Teams include your primary Cardiologist (physician) and Advanced Practice Providers (APPs -  Physician Assistants and Nurse Practitioners) who all work together to provide you with the care you need, when you need it.  We recommend signing up for the patient portal called "MyChart".  Sign up information is provided on this After Visit Summary.  MyChart is used to connect with patients for Virtual Visits (Telemedicine).  Patients are able to view lab/test results, encounter notes, upcoming appointments, etc.  Non-urgent messages can be sent to your provider as well.   To learn more about what you can do with MyChart, go to [email or web address].    Your next appointment:   6 month    Provider:   You may see Julien Nordmann, MD or one of the following Advanced Practice Providers on your designated Care Team:   Nicolasa Ducking, NP Eula Listen, PA-C Cadence Fransico Michael, PA-C Charlsie Quest, NP   Other Instructions Referral to Pulmonology - they will call you to schedule this appointment

## 2023-02-16 DIAGNOSIS — Z124 Encounter for screening for malignant neoplasm of cervix: Secondary | ICD-10-CM | POA: Diagnosis not present

## 2023-02-16 DIAGNOSIS — Z0131 Encounter for examination of blood pressure with abnormal findings: Secondary | ICD-10-CM | POA: Diagnosis not present

## 2023-02-16 DIAGNOSIS — Z309 Encounter for contraceptive management, unspecified: Secondary | ICD-10-CM | POA: Diagnosis not present

## 2023-02-16 DIAGNOSIS — Z113 Encounter for screening for infections with a predominantly sexual mode of transmission: Secondary | ICD-10-CM | POA: Diagnosis not present

## 2023-02-16 DIAGNOSIS — Z1389 Encounter for screening for other disorder: Secondary | ICD-10-CM | POA: Diagnosis not present

## 2023-04-06 ENCOUNTER — Institutional Professional Consult (permissible substitution): Payer: Managed Care, Other (non HMO) | Admitting: Nurse Practitioner

## 2023-12-13 NOTE — Progress Notes (Signed)
 Cardiology Office Note  Date:  12/14/2023   ID:  Dawn Stark, DOB November 09, 2000, MRN [id number]  PCP:  Macie Saxon, MD   Chief Complaint  Patient presents with   12 month follow up     "Doing well."     HPI:  [name] is a 23 year old woman with past medical history of Syncope blind Chronic constipation Headaches Who presents for follow-up of her sinus tachycardia and syncope  Last seen by myself in clinic 2019 Seen by one of our providers several times in 2024 last in July  Previously treated with atenolol then changed to metoprolol , was on metoprolol  for years On prior clinic visit May 2024 was out of her metoprolol , found to be tachycardic  Given concern of fatigue,started on verapamil  180 daily On her visit July 2024 was doing well on verapamil   Chronic abdominal issues   Zio monitor revealed a min HR of 72 bpm, max HR of 171 bpm, and avg HR of 102 bpm. No atrial fibrillation or atrial flutter.   On further discussion on verapamil  SR 180 daily, Feels heart racing at times  Weight down 10 pounds over the past year   Chronic constipation since age 52 History of hydrocephalus status post shunt Legally blind  Prior episode of syncope several months ago in the setting of needing to go have a bowel movement. She had abdominal cramping, urgency Felt lightheaded and went down Friend tried to get her up but she was still lightheaded Symptoms eventually resolved without intervention She fractured her ankle falling down No further episodes since that time   EKG personally reviewed by myself on todays visit EKG Interpretation Date/Time:  Friday Dec 14 2023 14:06:20 EDT Ventricular Rate:  120 PR Interval:  126 QRS Duration:  84 QT Interval:  304 QTC Calculation: 429 R Axis:   61  Text Interpretation: Sinus tachycardia Nonspecific T wave abnormality When compared with ECG of 09-Feb-2023 14:25, No significant change was found Confirmed by Belva Boyden  [phone]) on 12/14/2023 2:13:50 PM     PMH:   has a past medical history of Blind, Central sleep apnea, Constipation, GERD (gastroesophageal reflux disease), HPV in female, Hydrocephalus (HCC), Hypertension, Migraine, Open-angle glaucoma, Optic atrophy, both eyes, Preterm delivery, and Seizures (HCC).  PSH:    Past Surgical History:  Procedure Laterality Date   BRAIN SURGERY     And shunt placement   COLONOSCOPY WITH PROPOFOL  N/A 01/02/2020   Procedure: COLONOSCOPY WITH PROPOFOL ;  Surgeon: Selena Daily, MD;  Location: [facility] ENDOSCOPY;  Service: Gastroenterology;  Laterality: N/A;   CRAINOTOMY Left 01/2012   for dural biopsy   ESOPHAGOGASTRODUODENOSCOPY (EGD) WITH PROPOFOL  N/A 01/02/2020   Procedure: ESOPHAGOGASTRODUODENOSCOPY (EGD) WITH PROPOFOL ;  Surgeon: Selena Daily, MD;  Location: [facility] ENDOSCOPY;  Service: Gastroenterology;  Laterality: N/A;   EXCISION OF SUBGLOTIC CYST  01/1999   FRONTAL BOLT PLACEMENT Left 02/2010   SUB-GALEAL VENTRICULAR SHUNT  09/1998   VENTRICULOPERITONEAL SHUNT Right 10/1998   VENTRICULOPERITONEAL SHUNT  12/2005, 03/2006, 09/2009   REVISIONS   VENTRICULOPERITONEAL SHUNT     revision 01/2020 Dr. Jeris Montes now as of 09/01/21 no h/a    Current Outpatient Medications  Medication Sig Dispense Refill   etonogestrel  (NEXPLANON ) 68 MG IMPL implant 68 mg.     linaclotide  (LINZESS ) 290 MCG CAPS capsule TAKE 1 CAPSULE BY MOUTH DAILY BEFORE BREAKFAST. 90 capsule 3   omeprazole  (PRILOSEC) 40 MG capsule Take 40 mg by mouth in the morning and at bedtime.  verapamil  (CALAN -SR) 180 MG CR tablet Take 1 tablet (180 mg total) by mouth at bedtime. 90 tablet 3   No current facility-administered medications for this visit.    Allergies:   Lorazepam, Atenolol, and Diltiazem    Social History:  The patient  reports that she has never smoked. She has never used smokeless tobacco. She reports current alcohol use. She reports that she does not use drugs.   Family  History:   family history includes Heart attack in her father; Heart disease in her father.    Review of Systems: Review of Systems  Constitutional: Negative.   HENT: Negative.    Respiratory: Negative.    Cardiovascular: Negative.   Gastrointestinal: Negative.   Musculoskeletal: Negative.   Neurological: Negative.   Psychiatric/Behavioral: Negative.    All other systems reviewed and are negative.  PHYSICAL EXAM: VS:  BP 120/80 (BP Location: Left Arm, Patient Position: Sitting, Cuff Size: Normal)   Pulse (!) 120   Ht 4\' 9"  (1.448 m)   Wt 134 lb (60.8 kg)   SpO2 98%   BMI 29.00 kg/m  , BMI Body mass index is 29 kg/m. Constitutional:  oriented to person, place, and time. No distress.  HENT:  Head: Grossly normal Eyes:  no discharge. No scleral icterus.  Neck: No JVD, no carotid bruits  Cardiovascular: Tachycardic, regular rate and rhythm, no murmurs appreciated Pulmonary/Chest: Clear to auscultation bilaterally, no wheezes or rales Abdominal: Soft.  no distension.  no tenderness.  Musculoskeletal: Normal range of motion Neurological:  normal muscle tone. Coordination normal. No atrophy Skin: Skin warm and dry Psychiatric: normal affect, pleasant  Recent Labs: 12/22/2022: BUN 10; Creatinine, Ser 0.88; Hemoglobin 12.8; Platelets 312; Potassium 3.8; Sodium 139; TSH 1.417    Lipid Panel Lab Results  Component Value Date   CHOL 206 (H) 12/22/2022   HDL 40 (L) 12/22/2022   LDLCALC 131 (H) 12/22/2022   TRIG 176 (H) 12/22/2022      Wt Readings from Last 3 Encounters:  12/14/23 134 lb (60.8 kg)  02/09/23 143 lb 6.4 oz (65 kg)  12/22/22 144 lb 6.4 oz (65.5 kg)    ASSESSMENT AND PLAN:  Problem List Items Addressed This Visit       Cardiology Problems   Hypertension     Other   Gastroesophageal reflux disease   Relevant Medications   omeprazole  (PRILOSEC) 40 MG capsule   Palpitations   Relevant Orders   EKG 12-Lead (Completed)   Sinus tachycardia - Primary    Relevant Orders   EKG 12-Lead (Completed)   Chronic constipation   Central sleep apnea   Other Visit Diagnoses       Obesity (BMI 30-39.9)         Irritable bowel syndrome with constipation       Relevant Medications   omeprazole  (PRILOSEC) 40 MG capsule     Fatigue, unspecified type         Shortness of breath          Inappropriate sinus tachycardia Heart rate variable on Zio monitor Still appreciates strong pulse, racing Recommend she change verapamil  SR 180 to verapamil  SR 120 twice daily For continued symptoms could add low-dose bisoprolol  History of syncope In the setting of GI distress, likely vasovagal No further episodes at this time  Signed, Juanda Noon, M.D., Ph.D. [facility] [place], [place] [phone]

## 2023-12-14 ENCOUNTER — Ambulatory Visit: Payer: Managed Care, Other (non HMO) | Attending: Cardiovascular Disease | Admitting: Cardiovascular Disease

## 2023-12-14 ENCOUNTER — Encounter: Payer: Self-pay | Admitting: Cardiovascular Disease

## 2023-12-14 VITALS — BP 120/80 | HR 120 | Ht <= 58 in | Wt 134.0 lb

## 2023-12-14 DIAGNOSIS — R002 Palpitations: Secondary | ICD-10-CM | POA: Diagnosis not present

## 2023-12-14 DIAGNOSIS — G4731 Primary central sleep apnea: Secondary | ICD-10-CM | POA: Diagnosis not present

## 2023-12-14 DIAGNOSIS — R0602 Shortness of breath: Secondary | ICD-10-CM

## 2023-12-14 DIAGNOSIS — K5909 Other constipation: Secondary | ICD-10-CM

## 2023-12-14 DIAGNOSIS — K581 Irritable bowel syndrome with constipation: Secondary | ICD-10-CM

## 2023-12-14 DIAGNOSIS — E669 Obesity, unspecified: Secondary | ICD-10-CM

## 2023-12-14 DIAGNOSIS — R000 Tachycardia, unspecified: Secondary | ICD-10-CM

## 2023-12-14 DIAGNOSIS — R5383 Other fatigue: Secondary | ICD-10-CM | POA: Diagnosis not present

## 2023-12-14 DIAGNOSIS — I10 Essential (primary) hypertension: Secondary | ICD-10-CM

## 2023-12-14 DIAGNOSIS — K219 Gastro-esophageal reflux disease without esophagitis: Secondary | ICD-10-CM | POA: Diagnosis not present

## 2023-12-14 MED ORDER — VERAPAMIL HCL ER 120 MG PO TBCR: 120.0000 mg | EXTENDED_RELEASE_TABLET | Freq: Two times a day (BID) | ORAL | 3 refills | Status: AC

## 2023-12-14 NOTE — Patient Instructions (Addendum)
 Medication Instructions:  Please change verapamil  to SR 120 mg twice a day  If you need a refill on your cardiac medications before your next appointment, please call your pharmacy.   Lab work: No new labs needed  Testing/Procedures: No new testing needed  Follow-Up: At [facility], you and your health needs are our priority.  As part of our continuing mission to provide you with exceptional heart care, we have created designated Provider Care Teams.  These Care Teams include your primary Cardiologist (physician) and Advanced Practice Providers (APPs -  Physician Assistants and Nurse Practitioners) who all work together to provide you with the care you need, when you need it.  You will need a follow up appointment in 12 months  Providers on your designated Care Team:   Laneta Pintos, NP Varney Gentleman, PA-C Cadence Gennaro Khat, [place]  COVID-19 Vaccine Information can be found at: [email or web address] For questions related to vaccine distribution or appointments, please email [email or web address] or call [phone].

## 2024-08-19 ENCOUNTER — Encounter: Payer: Self-pay | Admitting: Cardiovascular Disease
